# Patient Record
Sex: Male | Born: 1960 | Race: White | Hispanic: No | Smoking: Current every day smoker
Health system: Southern US, Community
[De-identification: ages and names within clinical notes are randomized; demographics above are authoritative.]

## PROBLEM LIST (undated history)

## (undated) DIAGNOSIS — I639 Cerebral infarction, unspecified: Secondary | ICD-10-CM

## (undated) DIAGNOSIS — E119 Type 2 diabetes mellitus without complications: Secondary | ICD-10-CM

## (undated) DIAGNOSIS — Z8619 Personal history of other infectious and parasitic diseases: Secondary | ICD-10-CM

## (undated) DIAGNOSIS — I10 Essential (primary) hypertension: Secondary | ICD-10-CM

## (undated) DIAGNOSIS — M1990 Unspecified osteoarthritis, unspecified site: Secondary | ICD-10-CM

## (undated) DIAGNOSIS — T7840XA Allergy, unspecified, initial encounter: Secondary | ICD-10-CM

## (undated) DIAGNOSIS — K219 Gastro-esophageal reflux disease without esophagitis: Secondary | ICD-10-CM

## (undated) DIAGNOSIS — K635 Polyp of colon: Secondary | ICD-10-CM

## (undated) HISTORY — DX: Personal history of other infectious and parasitic diseases: Z86.19

## (undated) HISTORY — DX: Gastro-esophageal reflux disease without esophagitis: K21.9

## (undated) HISTORY — PX: APPENDECTOMY: SHX54

## (undated) HISTORY — DX: Unspecified osteoarthritis, unspecified site: M19.90

## (undated) HISTORY — DX: Cerebral infarction, unspecified: I63.9

## (undated) HISTORY — DX: Allergy, unspecified, initial encounter: T78.40XA

## (undated) HISTORY — DX: Polyp of colon: K63.5

---

## 2004-01-25 ENCOUNTER — Observation Stay (HOSPITAL_COMMUNITY): Admission: EM | Admit: 2004-01-25 | Discharge: 2004-01-26 | Payer: Self-pay | Admitting: Emergency Medicine

## 2004-02-15 ENCOUNTER — Emergency Department (HOSPITAL_COMMUNITY): Admission: EM | Admit: 2004-02-15 | Discharge: 2004-02-16 | Payer: Self-pay | Admitting: Emergency Medicine

## 2005-03-01 IMAGING — CR DG CHEST 1V PORT
1 series · 1 of 1 positions shown · non-contrast
Comparison: none

CLINICAL DATA: Chest pain.
 PORTABLE CHEST 
 AP semierect portable with no priors.
 Heart and vascularity are within normal limits for portable technique.  The lungs are clear.

[view not recorded]
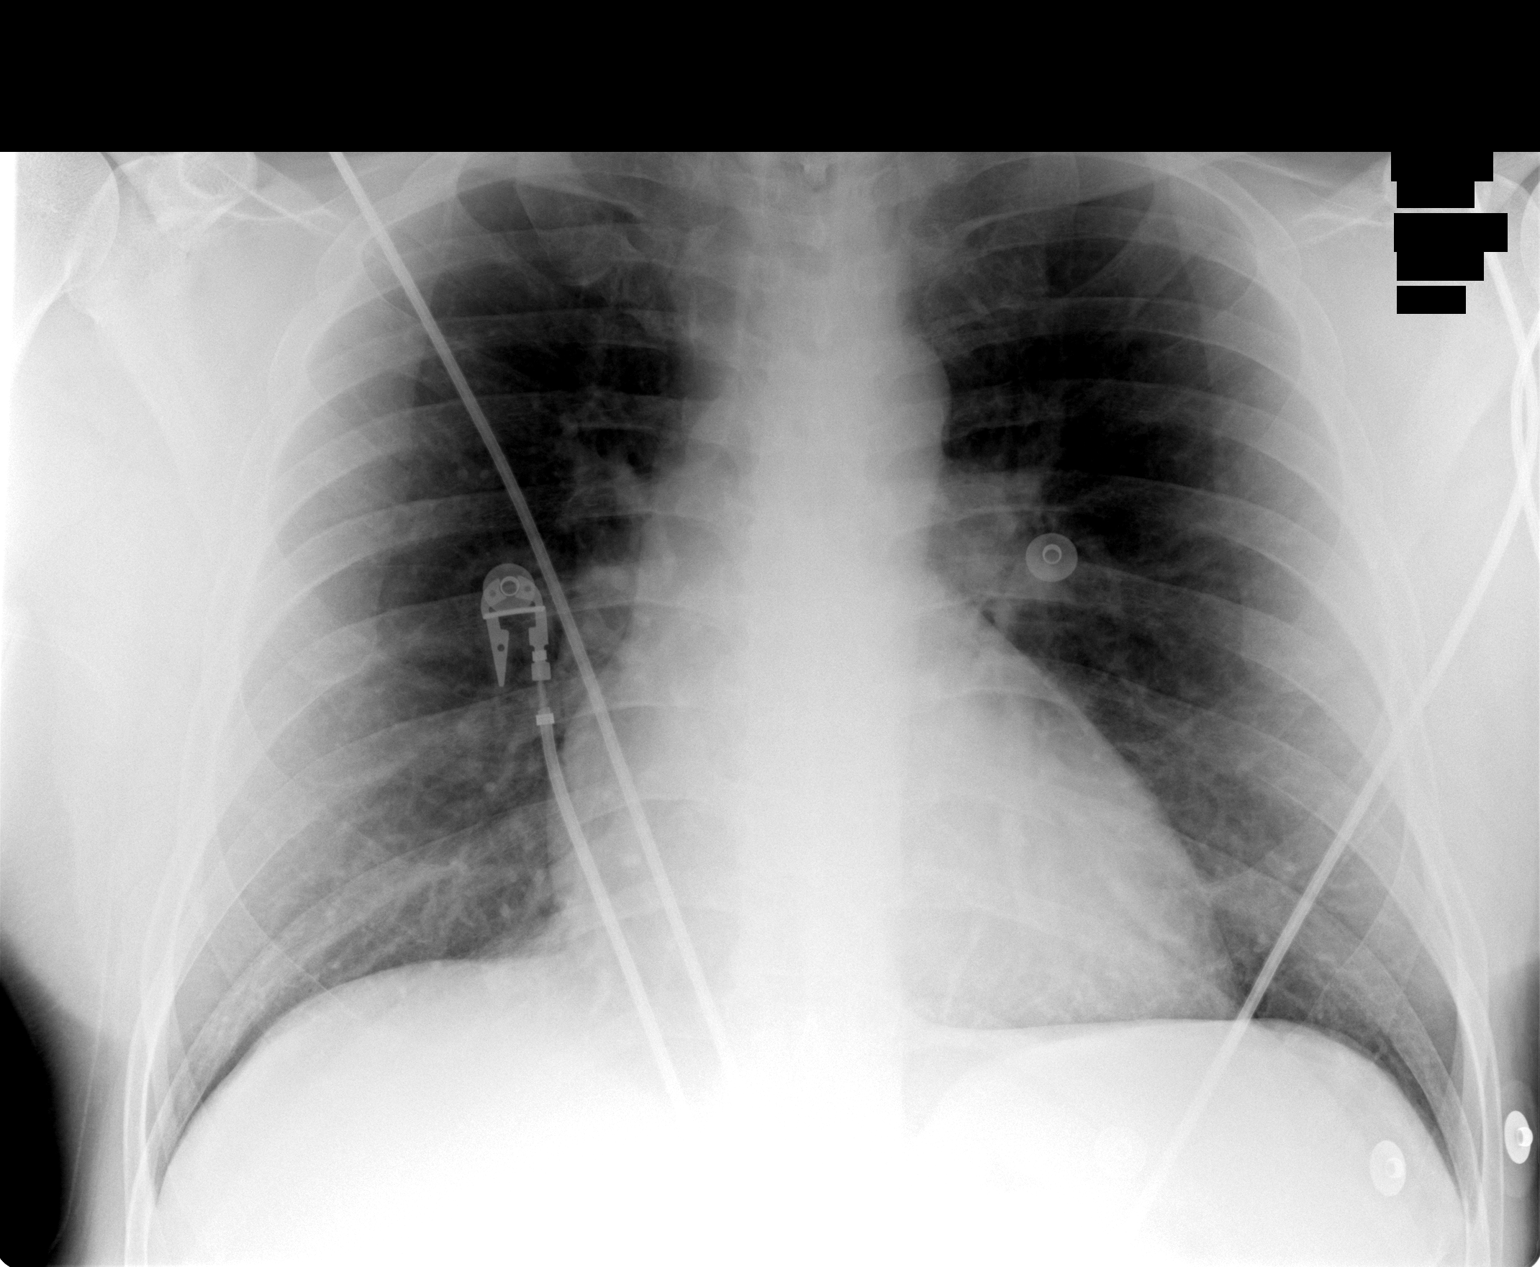

[1 of 1 positions shown; findings below may reference images not displayed]

IMPRESSION: No active disease.

## 2005-03-23 IMAGING — CT CT CHEST W/ CM
1 of 3 series · 15 of 28 positions shown, 19 images · IV contrast (omnipaque)
Comparison: none

CLINICAL DATA: 42-year-old hypertensive with chest pain.
 CT CHEST WITH CONTRAST
 Helical CT examination of the chest was performed after the bolus infusion of 150 cc of Omnipaque 300 using pulmonary embolism protocol.  The chest wall, soft tissues and bony structures are unremarkable. 
 Examination of the mediastinum demonstrates a normal heart size.  No pericardial effusion.  No mediastinal or hilar adenopathy.  The esophagus appears normal.  The pulmonary arterial tree is well opacified and no filling defects are seen to suggest pulmonary emboli. 
 The ascending aorta is slightly prominent with maximal measurements of 3.7 x 3.6 cm.  This probably warrants surveillance.  The descending thoracic aorta is normal in caliber and there is no evidence for a dissection. 
 The lungs are clear.  
 IMPRESSION
 1.  No CT evidence for pulmonary emboli. 
 2.  No acute pulmonary findings. 
 3.  Mild fusiform dilatation of the ascending aorta.  Recommend surveillance given the patient?s age.
 CT LOWER EXTREMITIES WITH CONTRAST, LIMITED
 CT examination of the lower extremities and pelvis with noncontiguous images demonstrates no filling defects to suggest deep venous thrombosis.  
 No CT evidence for deep venous thrombosis in the lower extremities or pelvis.

[Series 2: pe · axial · 0.70mm/px · z∈[-266,-58]mm · 15 of 190 slices shown, 19 images]
[im 12/190  mediastinal]
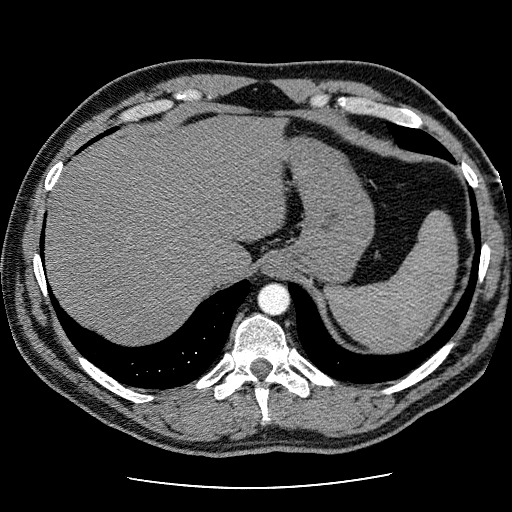
[im 12/190  lung]
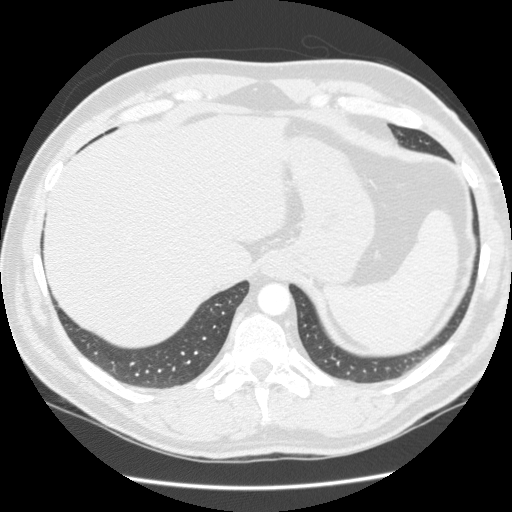
[im 24/190  lung]
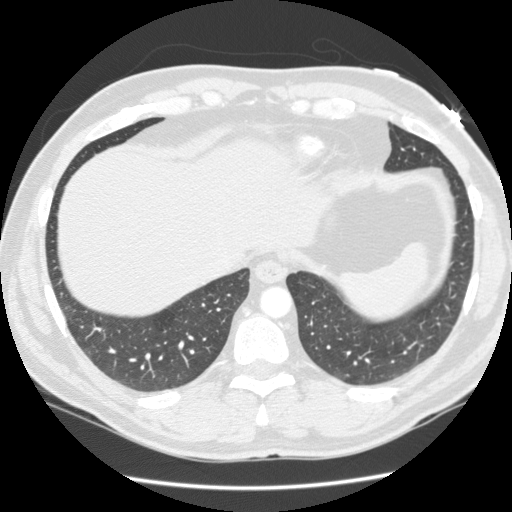
[im 36/190  lung]
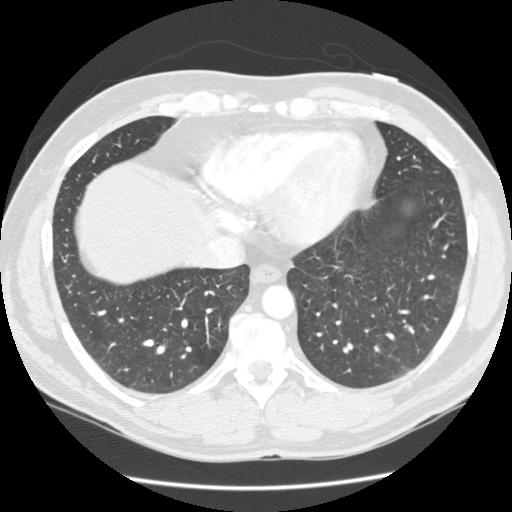
[im 48/190  lung]
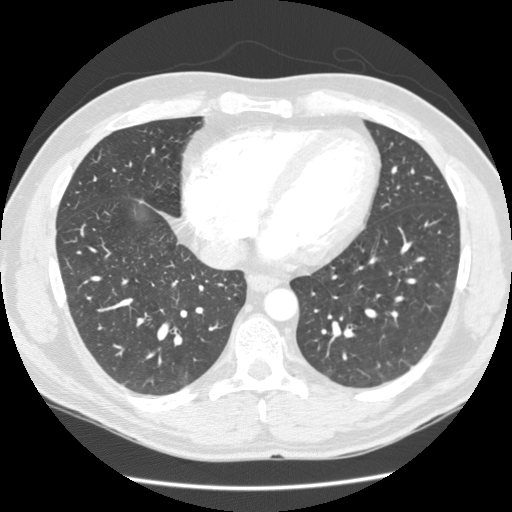
[im 60/190  mediastinal]
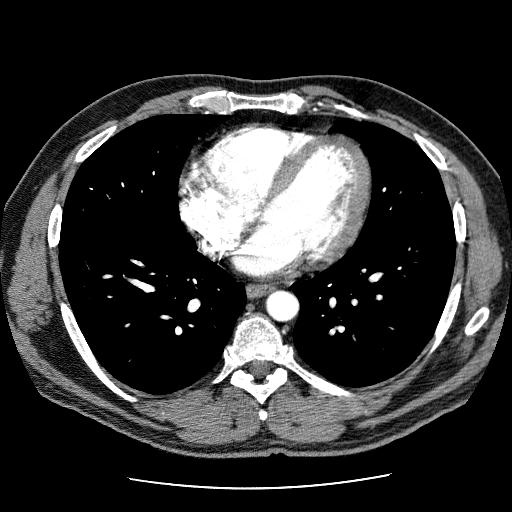
[im 60/190  lung]
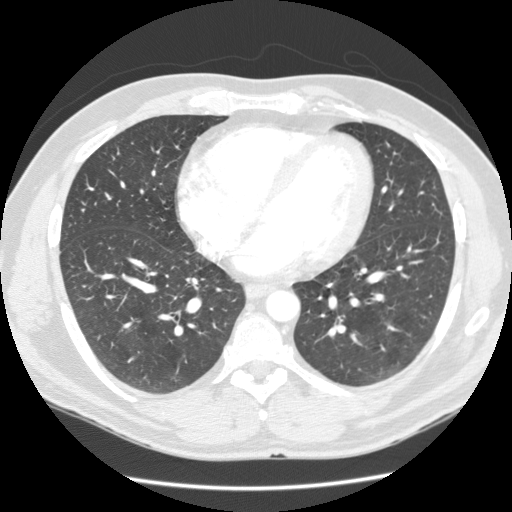
[im 71/190  lung]
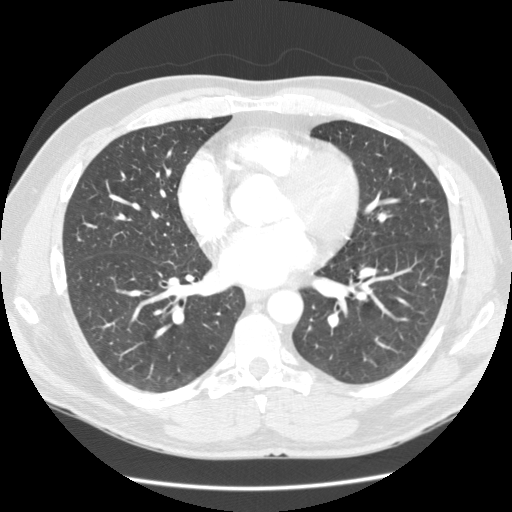
[im 83/190  lung]
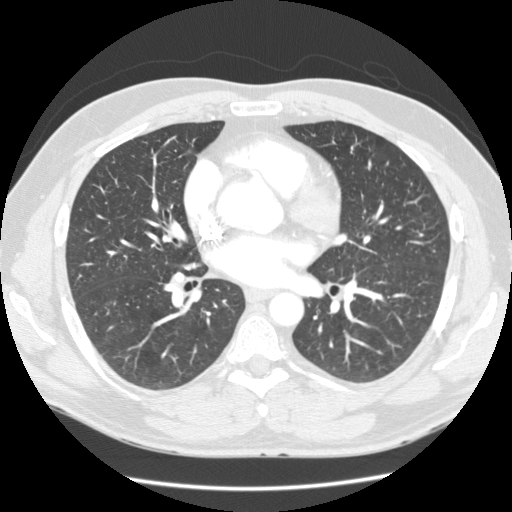
[im 95/190  lung]
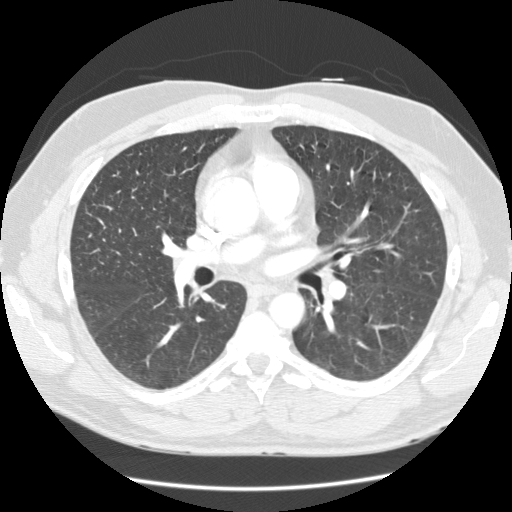
[im 107/190  mediastinal]
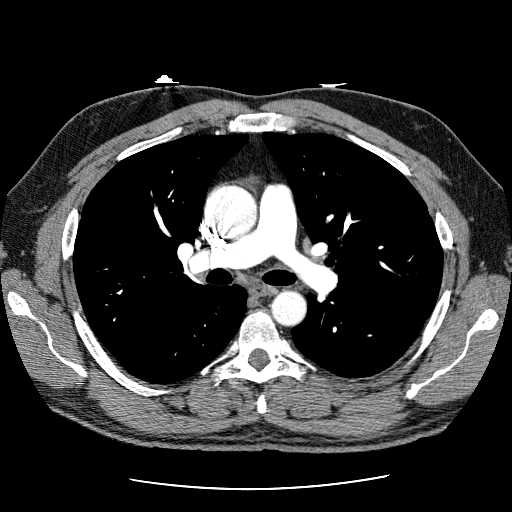
[im 107/190  lung]
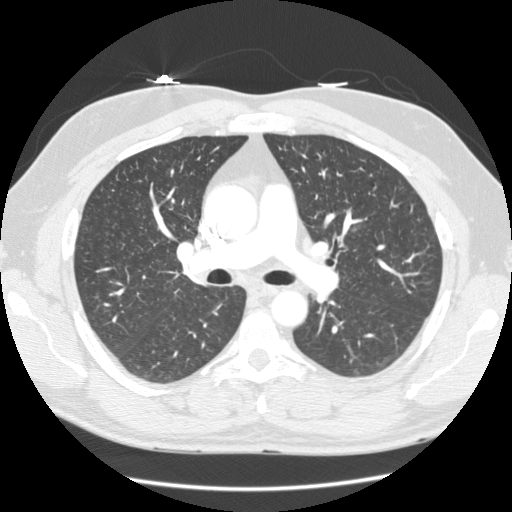
[im 119/190  lung]
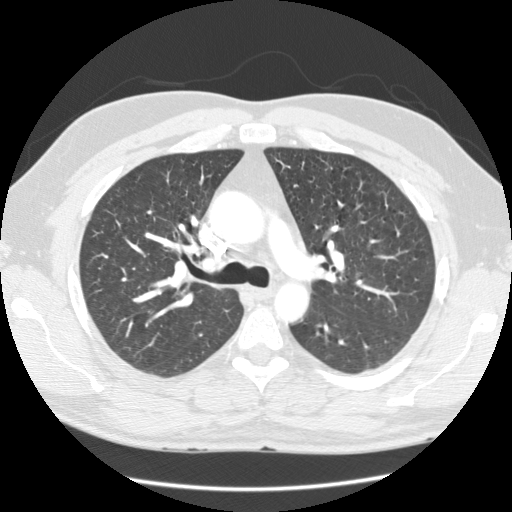
[im 130/190  lung]
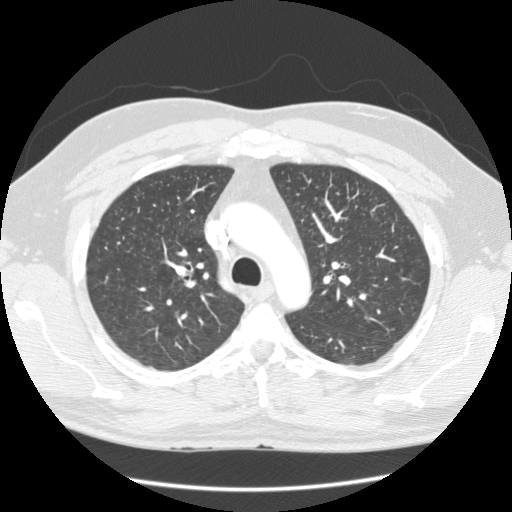
[im 142/190  lung]
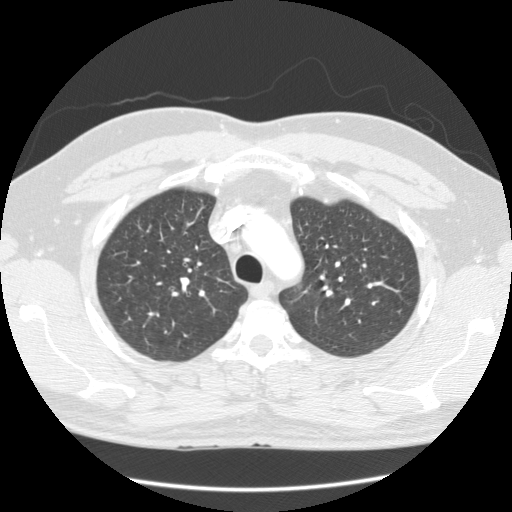
[im 154/190  mediastinal]
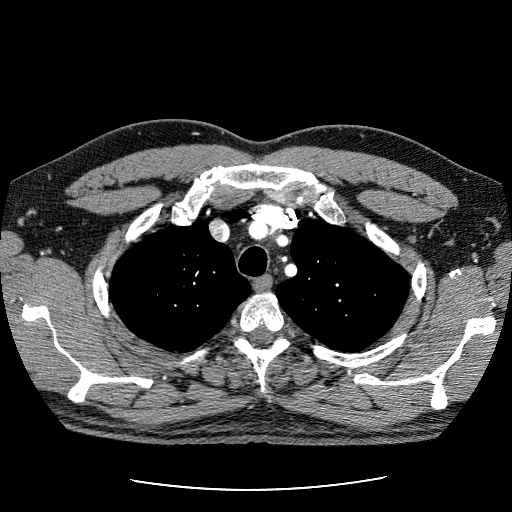
[im 154/190  lung]
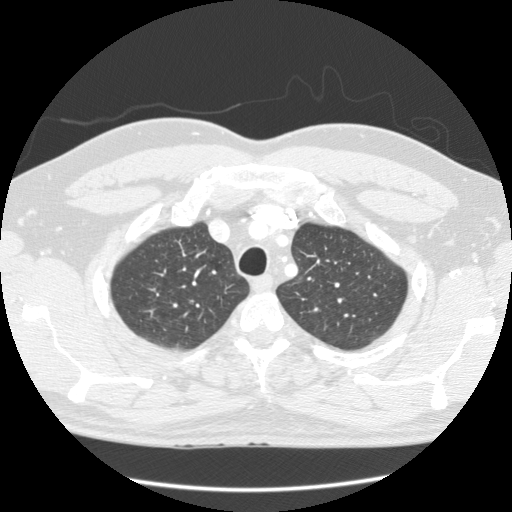
[im 166/190  lung]
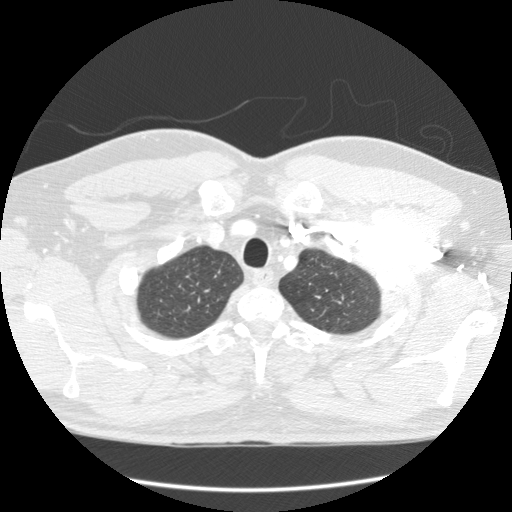
[im 178/190  lung]
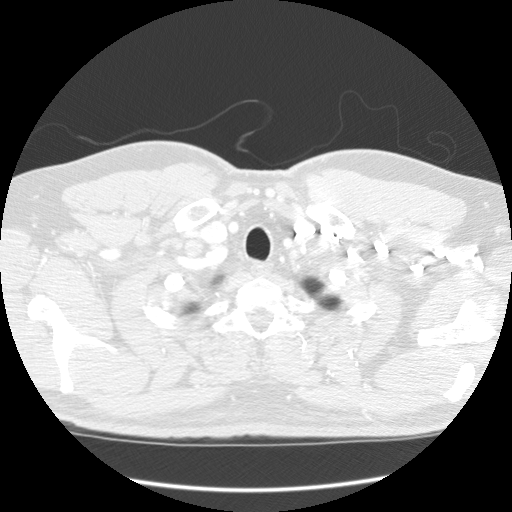

[15 of 28 positions shown; findings below may reference images not displayed]

## 2005-06-11 ENCOUNTER — Ambulatory Visit: Payer: Self-pay | Admitting: Internal Medicine

## 2005-06-19 ENCOUNTER — Ambulatory Visit: Payer: Self-pay | Admitting: Internal Medicine

## 2005-08-14 ENCOUNTER — Ambulatory Visit: Payer: Self-pay | Admitting: Internal Medicine

## 2019-01-02 DIAGNOSIS — I10 Essential (primary) hypertension: Secondary | ICD-10-CM | POA: Insufficient documentation

## 2020-04-22 DIAGNOSIS — F17200 Nicotine dependence, unspecified, uncomplicated: Secondary | ICD-10-CM | POA: Diagnosis present

## 2022-01-30 DIAGNOSIS — Z719 Counseling, unspecified: Secondary | ICD-10-CM | POA: Diagnosis not present

## 2022-01-30 DIAGNOSIS — M25519 Pain in unspecified shoulder: Secondary | ICD-10-CM | POA: Diagnosis not present

## 2022-01-30 DIAGNOSIS — M25512 Pain in left shoulder: Secondary | ICD-10-CM | POA: Diagnosis not present

## 2022-01-30 DIAGNOSIS — X503XXA Overexertion from repetitive movements, initial encounter: Secondary | ICD-10-CM | POA: Diagnosis not present

## 2022-01-30 DIAGNOSIS — T148XXA Other injury of unspecified body region, initial encounter: Secondary | ICD-10-CM | POA: Diagnosis not present

## 2022-02-14 DIAGNOSIS — N411 Chronic prostatitis: Secondary | ICD-10-CM | POA: Diagnosis not present

## 2022-02-14 DIAGNOSIS — E1169 Type 2 diabetes mellitus with other specified complication: Secondary | ICD-10-CM | POA: Diagnosis not present

## 2022-02-14 DIAGNOSIS — N521 Erectile dysfunction due to diseases classified elsewhere: Secondary | ICD-10-CM | POA: Diagnosis not present

## 2022-02-14 DIAGNOSIS — E119 Type 2 diabetes mellitus without complications: Secondary | ICD-10-CM | POA: Diagnosis not present

## 2022-02-19 DIAGNOSIS — E1169 Type 2 diabetes mellitus with other specified complication: Secondary | ICD-10-CM | POA: Diagnosis not present

## 2022-02-19 DIAGNOSIS — E785 Hyperlipidemia, unspecified: Secondary | ICD-10-CM | POA: Diagnosis not present

## 2022-02-19 DIAGNOSIS — I10 Essential (primary) hypertension: Secondary | ICD-10-CM | POA: Diagnosis not present

## 2022-02-19 DIAGNOSIS — N521 Erectile dysfunction due to diseases classified elsewhere: Secondary | ICD-10-CM | POA: Diagnosis not present

## 2022-02-26 DIAGNOSIS — E119 Type 2 diabetes mellitus without complications: Secondary | ICD-10-CM | POA: Diagnosis not present

## 2022-02-26 DIAGNOSIS — I10 Essential (primary) hypertension: Secondary | ICD-10-CM | POA: Diagnosis not present

## 2022-02-26 DIAGNOSIS — N521 Erectile dysfunction due to diseases classified elsewhere: Secondary | ICD-10-CM | POA: Diagnosis not present

## 2022-02-26 DIAGNOSIS — E785 Hyperlipidemia, unspecified: Secondary | ICD-10-CM | POA: Diagnosis not present

## 2022-04-18 DIAGNOSIS — E1169 Type 2 diabetes mellitus with other specified complication: Secondary | ICD-10-CM | POA: Diagnosis not present

## 2022-04-18 DIAGNOSIS — N521 Erectile dysfunction due to diseases classified elsewhere: Secondary | ICD-10-CM | POA: Diagnosis not present

## 2022-04-18 DIAGNOSIS — N411 Chronic prostatitis: Secondary | ICD-10-CM | POA: Diagnosis not present

## 2022-08-15 DIAGNOSIS — E119 Type 2 diabetes mellitus without complications: Secondary | ICD-10-CM | POA: Diagnosis not present

## 2022-08-15 DIAGNOSIS — Z7984 Long term (current) use of oral hypoglycemic drugs: Secondary | ICD-10-CM | POA: Diagnosis not present

## 2022-08-20 DIAGNOSIS — E785 Hyperlipidemia, unspecified: Secondary | ICD-10-CM | POA: Diagnosis not present

## 2022-08-20 DIAGNOSIS — E119 Type 2 diabetes mellitus without complications: Secondary | ICD-10-CM | POA: Diagnosis not present

## 2022-08-20 DIAGNOSIS — I10 Essential (primary) hypertension: Secondary | ICD-10-CM | POA: Diagnosis not present

## 2022-08-20 DIAGNOSIS — Z0000 Encounter for general adult medical examination without abnormal findings: Secondary | ICD-10-CM | POA: Diagnosis not present

## 2022-08-27 DIAGNOSIS — F17210 Nicotine dependence, cigarettes, uncomplicated: Secondary | ICD-10-CM | POA: Diagnosis not present

## 2022-08-27 DIAGNOSIS — E1169 Type 2 diabetes mellitus with other specified complication: Secondary | ICD-10-CM | POA: Diagnosis not present

## 2022-08-27 DIAGNOSIS — Z0000 Encounter for general adult medical examination without abnormal findings: Secondary | ICD-10-CM | POA: Diagnosis not present

## 2022-08-27 DIAGNOSIS — I10 Essential (primary) hypertension: Secondary | ICD-10-CM | POA: Diagnosis not present

## 2022-08-27 DIAGNOSIS — Z1389 Encounter for screening for other disorder: Secondary | ICD-10-CM | POA: Diagnosis not present

## 2022-08-27 DIAGNOSIS — E785 Hyperlipidemia, unspecified: Secondary | ICD-10-CM | POA: Diagnosis not present

## 2022-08-27 DIAGNOSIS — Z7902 Long term (current) use of antithrombotics/antiplatelets: Secondary | ICD-10-CM | POA: Diagnosis not present

## 2022-08-27 DIAGNOSIS — Z716 Tobacco abuse counseling: Secondary | ICD-10-CM | POA: Diagnosis not present

## 2022-09-04 DIAGNOSIS — Z136 Encounter for screening for cardiovascular disorders: Secondary | ICD-10-CM | POA: Diagnosis not present

## 2022-09-04 DIAGNOSIS — F17200 Nicotine dependence, unspecified, uncomplicated: Secondary | ICD-10-CM | POA: Diagnosis not present

## 2022-09-04 DIAGNOSIS — Z87891 Personal history of nicotine dependence: Secondary | ICD-10-CM | POA: Diagnosis not present

## 2022-09-04 DIAGNOSIS — Z122 Encounter for screening for malignant neoplasm of respiratory organs: Secondary | ICD-10-CM | POA: Diagnosis not present

## 2022-10-28 ENCOUNTER — Emergency Department (HOSPITAL_COMMUNITY): Payer: BC Managed Care – PPO

## 2022-10-28 ENCOUNTER — Other Ambulatory Visit (HOSPITAL_COMMUNITY): Payer: Self-pay | Admitting: *Deleted

## 2022-10-28 ENCOUNTER — Observation Stay (HOSPITAL_COMMUNITY)
Admission: EM | Admit: 2022-10-28 | Discharge: 2022-10-29 | Disposition: A | Discharge status: Home / Self Care | Payer: BC Managed Care – PPO | Attending: Internal Medicine | Admitting: Internal Medicine

## 2022-10-28 ENCOUNTER — Observation Stay (HOSPITAL_BASED_OUTPATIENT_CLINIC_OR_DEPARTMENT_OTHER): Payer: BC Managed Care – PPO

## 2022-10-28 ENCOUNTER — Other Ambulatory Visit: Payer: Self-pay

## 2022-10-28 ENCOUNTER — Encounter (HOSPITAL_COMMUNITY): Payer: Self-pay | Admitting: Emergency Medicine

## 2022-10-28 DIAGNOSIS — F17210 Nicotine dependence, cigarettes, uncomplicated: Secondary | ICD-10-CM | POA: Insufficient documentation

## 2022-10-28 DIAGNOSIS — M47812 Spondylosis without myelopathy or radiculopathy, cervical region: Secondary | ICD-10-CM | POA: Diagnosis not present

## 2022-10-28 DIAGNOSIS — I339 Acute and subacute endocarditis, unspecified: Secondary | ICD-10-CM | POA: Diagnosis not present

## 2022-10-28 DIAGNOSIS — I639 Cerebral infarction, unspecified: Secondary | ICD-10-CM | POA: Diagnosis not present

## 2022-10-28 DIAGNOSIS — I6359 Cerebral infarction due to unspecified occlusion or stenosis of other cerebral artery: Secondary | ICD-10-CM

## 2022-10-28 DIAGNOSIS — E785 Hyperlipidemia, unspecified: Secondary | ICD-10-CM | POA: Insufficient documentation

## 2022-10-28 DIAGNOSIS — R29701 NIHSS score 1: Secondary | ICD-10-CM | POA: Diagnosis not present

## 2022-10-28 DIAGNOSIS — E119 Type 2 diabetes mellitus without complications: Secondary | ICD-10-CM

## 2022-10-28 DIAGNOSIS — Z7984 Long term (current) use of oral hypoglycemic drugs: Secondary | ICD-10-CM | POA: Insufficient documentation

## 2022-10-28 DIAGNOSIS — R200 Anesthesia of skin: Secondary | ICD-10-CM | POA: Diagnosis not present

## 2022-10-28 DIAGNOSIS — E782 Mixed hyperlipidemia: Secondary | ICD-10-CM

## 2022-10-28 DIAGNOSIS — Z7982 Long term (current) use of aspirin: Secondary | ICD-10-CM | POA: Diagnosis not present

## 2022-10-28 DIAGNOSIS — Z794 Long term (current) use of insulin: Secondary | ICD-10-CM | POA: Diagnosis not present

## 2022-10-28 DIAGNOSIS — I10 Essential (primary) hypertension: Secondary | ICD-10-CM | POA: Insufficient documentation

## 2022-10-28 DIAGNOSIS — E876 Hypokalemia: Secondary | ICD-10-CM | POA: Diagnosis not present

## 2022-10-28 DIAGNOSIS — F17200 Nicotine dependence, unspecified, uncomplicated: Secondary | ICD-10-CM | POA: Diagnosis present

## 2022-10-28 DIAGNOSIS — Z79899 Other long term (current) drug therapy: Secondary | ICD-10-CM | POA: Insufficient documentation

## 2022-10-28 DIAGNOSIS — R202 Paresthesia of skin: Secondary | ICD-10-CM

## 2022-10-28 HISTORY — DX: Type 2 diabetes mellitus without complications: E11.9

## 2022-10-28 HISTORY — DX: Essential (primary) hypertension: I10

## 2022-10-28 LAB — CBC
HCT: 44 % (ref 39.0–52.0)
HCT: 41.9 % (ref 39.0–52.0)
Hemoglobin: 15.2 g/dL (ref 13.0–17.0)
Hemoglobin: 14.8 g/dL (ref 13.0–17.0)
MCH: 30 pg (ref 26.0–34.0)
MCH: 30.5 pg (ref 26.0–34.0)
MCHC: 34.5 g/dL (ref 30.0–36.0)
MCHC: 35.3 g/dL (ref 30.0–36.0)
MCV: 86.8 fL (ref 80.0–100.0)
MCV: 86.2 fL (ref 80.0–100.0)
Platelets: 223 10*3/uL (ref 150–400)
Platelets: 235 10*3/uL (ref 150–400)
RBC: 5.07 MIL/uL (ref 4.22–5.81)
RBC: 4.86 MIL/uL (ref 4.22–5.81)
RDW: 14.2 % (ref 11.5–15.5)
RDW: 14.2 % (ref 11.5–15.5)
WBC: 10 10*3/uL (ref 4.0–10.5)
WBC: 11.2 10*3/uL — ABNORMAL HIGH (ref 4.0–10.5)
nRBC: 0 % (ref 0.0–0.2)
nRBC: 0 % (ref 0.0–0.2)

## 2022-10-28 LAB — GLUCOSE, CAPILLARY
Glucose-Capillary: 115 mg/dL — ABNORMAL HIGH (ref 70–99)
Glucose-Capillary: 259 mg/dL — ABNORMAL HIGH (ref 70–99)

## 2022-10-28 LAB — RAPID URINE DRUG SCREEN, HOSP PERFORMED
Amphetamines: NOT DETECTED
Barbiturates: NOT DETECTED
Benzodiazepines: NOT DETECTED
Cocaine: NOT DETECTED
Opiates: NOT DETECTED
Tetrahydrocannabinol: NOT DETECTED

## 2022-10-28 LAB — URINALYSIS, ROUTINE W REFLEX MICROSCOPIC
Bacteria, UA: NONE SEEN
Bilirubin Urine: NEGATIVE
Glucose, UA: >=500 mg/dL — AB
Hgb urine dipstick: NEGATIVE
Ketones, ur: NEGATIVE mg/dL
Leukocytes,Ua: NEGATIVE
Nitrite: NEGATIVE
Protein, ur: NEGATIVE mg/dL
Specific Gravity, Urine: 1.001 — ABNORMAL LOW (ref 1.005–1.030)
pH: 6 (ref 5.0–8.0)

## 2022-10-28 LAB — DIFFERENTIAL
Abs Immature Granulocytes: 0.03 10*3/uL (ref 0.00–0.07)
Basophils Absolute: 0.1 10*3/uL (ref 0.0–0.1)
Basophils Relative: 1 %
Eosinophils Absolute: 0.3 10*3/uL (ref 0.0–0.5)
Eosinophils Relative: 3 %
Immature Granulocytes: 0 %
Lymphocytes Relative: 24 %
Lymphs Abs: 2.4 10*3/uL (ref 0.7–4.0)
Monocytes Absolute: 1 10*3/uL (ref 0.1–1.0)
Monocytes Relative: 10 %
Neutro Abs: 6.2 10*3/uL (ref 1.7–7.7)
Neutrophils Relative %: 62 %

## 2022-10-28 LAB — HIV ANTIBODY (ROUTINE TESTING W REFLEX): HIV Screen 4th Generation wRfx: NONREACTIVE

## 2022-10-28 LAB — ECHOCARDIOGRAM COMPLETE
Area-P 1/2: 5.02 cm2
Height: 72 in
S' Lateral: 2.9 cm
Weight: 2880 oz

## 2022-10-28 LAB — COMPREHENSIVE METABOLIC PANEL
ALT: 22 U/L (ref 0–44)
ALT: 22 U/L (ref 0–44)
AST: 22 U/L (ref 15–41)
AST: 20 U/L (ref 15–41)
Albumin: 4.1 g/dL (ref 3.5–5.0)
Albumin: 4 g/dL (ref 3.5–5.0)
Alkaline Phosphatase: 60 U/L (ref 38–126)
Alkaline Phosphatase: 62 U/L (ref 38–126)
Anion gap: 12 (ref 5–15)
Anion gap: 9 (ref 5–15)
BUN: 13 mg/dL (ref 8–23)
BUN: 12 mg/dL (ref 8–23)
CO2: 21 mmol/L — ABNORMAL LOW (ref 22–32)
CO2: 22 mmol/L (ref 22–32)
Calcium: 8.7 mg/dL — ABNORMAL LOW (ref 8.9–10.3)
Calcium: 8.7 mg/dL — ABNORMAL LOW (ref 8.9–10.3)
Chloride: 102 mmol/L (ref 98–111)
Chloride: 106 mmol/L (ref 98–111)
Creatinine, Ser: 0.98 mg/dL (ref 0.61–1.24)
Creatinine, Ser: 0.84 mg/dL (ref 0.61–1.24)
GFR, Estimated: >60 mL/min (ref >60)
GFR, Estimated: >60 mL/min (ref >60)
Glucose, Bld: 265 mg/dL — ABNORMAL HIGH (ref 70–99)
Glucose, Bld: 168 mg/dL — ABNORMAL HIGH (ref 70–99)
Potassium: 3.1 mmol/L — ABNORMAL LOW (ref 3.5–5.1)
Potassium: 3.4 mmol/L — ABNORMAL LOW (ref 3.5–5.1)
Sodium: 135 mmol/L (ref 135–145)
Sodium: 137 mmol/L (ref 135–145)
Total Bilirubin: 0.3 mg/dL (ref 0.3–1.2)
Total Bilirubin: 0.4 mg/dL (ref 0.3–1.2)
Total Protein: 7.1 g/dL (ref 6.5–8.1)
Total Protein: 7.3 g/dL (ref 6.5–8.1)

## 2022-10-28 LAB — PROTIME-INR
INR: 0.8 (ref 0.8–1.2)
Prothrombin Time: 11.4 seconds (ref 11.4–15.2)

## 2022-10-28 LAB — LIPID PANEL
Cholesterol: 146 mg/dL (ref 0–200)
HDL: 51 mg/dL (ref >40)
LDL Cholesterol: 61 mg/dL (ref 0–99)
Total CHOL/HDL Ratio: 2.9 RATIO
Triglycerides: 168 mg/dL — ABNORMAL HIGH (ref <150)
VLDL: 34 mg/dL (ref 0–40)

## 2022-10-28 LAB — APTT: aPTT: 26 seconds (ref 24–36)

## 2022-10-28 LAB — CBG MONITORING, ED
Glucose-Capillary: 150 mg/dL — ABNORMAL HIGH (ref 70–99)
Glucose-Capillary: 291 mg/dL — ABNORMAL HIGH (ref 70–99)

## 2022-10-28 LAB — ETHANOL: Alcohol, Ethyl (B): <10 mg/dL (ref <10)

## 2022-10-28 MED ORDER — ACETAMINOPHEN 160 MG/5ML PO SOLN: 650.0000 mg | ORAL | Status: DC | PRN

## 2022-10-28 MED ORDER — ACETAMINOPHEN 325 MG PO TABS: 650.0000 mg | ORAL_TABLET | ORAL | Status: DC | PRN

## 2022-10-28 MED ORDER — ASPIRIN 81 MG PO CHEW
81.0000 mg | CHEWABLE_TABLET | Freq: Every day | ORAL | Status: DC
  Administered 2022-10-28 – 2022-10-29 (×2): 81 mg via ORAL
  Filled 2022-10-28 (×2): qty 1

## 2022-10-28 MED ORDER — STROKE: EARLY STAGES OF RECOVERY BOOK
Freq: Once | Status: AC
  Filled 2022-10-28 (×2): qty 1

## 2022-10-28 MED ORDER — HEPARIN SODIUM (PORCINE) 5000 UNIT/ML IJ SOLN
5000.0000 [IU] | Freq: Three times a day (TID) | INTRAMUSCULAR | Status: DC
  Administered 2022-10-28 – 2022-10-29 (×5): 5000 [IU] via SUBCUTANEOUS
  Filled 2022-10-28 (×5): qty 1

## 2022-10-28 MED ORDER — ATORVASTATIN CALCIUM 10 MG PO TABS
20.0000 mg | ORAL_TABLET | Freq: Every day | ORAL | Status: DC
  Administered 2022-10-28 – 2022-10-29 (×2): 20 mg via ORAL
  Filled 2022-10-28 (×2): qty 2

## 2022-10-28 MED ORDER — SENNOSIDES-DOCUSATE SODIUM 8.6-50 MG PO TABS: 1.0000 | ORAL_TABLET | Freq: Every evening | ORAL | Status: DC | PRN

## 2022-10-28 MED ORDER — CLOPIDOGREL BISULFATE 75 MG PO TABS
75.0000 mg | ORAL_TABLET | Freq: Every day | ORAL | Status: DC
  Administered 2022-10-28 – 2022-10-29 (×2): 75 mg via ORAL
  Filled 2022-10-28 (×2): qty 1

## 2022-10-28 MED ORDER — CLOPIDOGREL BISULFATE 75 MG PO TABS
300.0000 mg | ORAL_TABLET | Freq: Once | ORAL | Status: AC
  Administered 2022-10-28: 300 mg via ORAL
  Filled 2022-10-28: qty 4

## 2022-10-28 MED ORDER — INSULIN ASPART 100 UNIT/ML IJ SOLN
0.0000 [IU] | Freq: Three times a day (TID) | INTRAMUSCULAR | Status: DC
  Administered 2022-10-28: 2 [IU] via SUBCUTANEOUS
  Administered 2022-10-28: 8 [IU] via SUBCUTANEOUS
  Administered 2022-10-29: 5 [IU] via SUBCUTANEOUS
  Administered 2022-10-29: 3 [IU] via SUBCUTANEOUS
  Filled 2022-10-28 (×2): qty 1

## 2022-10-28 MED ORDER — INSULIN GLARGINE-YFGN 100 UNIT/ML ~~LOC~~ SOLN
7.0000 [IU] | Freq: Every day | SUBCUTANEOUS | Status: DC
  Administered 2022-10-28: 7 [IU] via SUBCUTANEOUS
  Filled 2022-10-28 (×3): qty 0.07

## 2022-10-28 MED ORDER — POTASSIUM CHLORIDE CRYS ER 20 MEQ PO TBCR
40.0000 meq | EXTENDED_RELEASE_TABLET | Freq: Once | ORAL | Status: AC
  Administered 2022-10-28: 40 meq via ORAL
  Filled 2022-10-28: qty 2

## 2022-10-28 MED ORDER — ACETAMINOPHEN 650 MG RE SUPP: 650.0000 mg | RECTAL | Status: DC | PRN

## 2022-10-28 MED ORDER — INSULIN ASPART 100 UNIT/ML IJ SOLN
0.0000 [IU] | Freq: Every day | INTRAMUSCULAR | Status: DC
  Administered 2022-10-28: 3 [IU] via SUBCUTANEOUS

## 2022-10-28 NOTE — H&P (Signed)
History and Physical    Patient: Aaron Martinez MCE:[id number] DOB: 04-16-1961 DOA: 10/28/2022 DOS: the patient was seen and examined on 10/28/2022 PCP: Gearlean Alf, FNP  Patient coming from: Home  Chief Complaint:  Chief Complaint  Patient presents with   Numbness   HPI: Carmin Dibartolo is a 61 y.o. male with medical history significant of diabetes mellitus type 2, hypertension, tobacco use disorder, hyperlipidemia, and more presents to the ED with a chief complaint of numbness.  Patient reports he was sitting watching TV when he noticed left arm, hand, face numbness.  He reports it was sudden onset.  He has never had anything like it before.  He has had where his lips have been done in the past, but that was from a kind of Chapstick he was using.  Patient reports that while he has had this numbness, he has not had any dysphagia, change in vision, change in hearing, dysarthria.  He reports he had no ataxia as well.  He does not think his left foot has been affected, there was one moment where it felt numb, but he attributes improvement to positional change.  Patient has otherwise been in his normal state of health.  He has had no chest pain, palpitations, headaches, infectious symptoms, or other complaints.  Patient does smoke a pack per day.  He does drink alcohol 2 beers 5 days/week.  He does not use illicit drugs.  Patient is vaccinated for COVID.  Patient is full code. Review of Systems: As mentioned in the history of present illness. All other systems reviewed and are negative. Past Medical History:  Diagnosis Date   Diabetes mellitus without complication (HCC)    Hypertension     Social History:  reports that he has been smoking cigarettes. He has never used smokeless tobacco. No history on file for alcohol use and drug use.    No family history on file.  Family history does not contribute  Prior to Admission medications   Medication Sig Start Date End Date Taking? Authorizing  Provider  atorvastatin (LIPITOR) 10 MG tablet Take 10 mg by mouth 3 (three) times a week. 10/13/22  Yes [provider]  hydrochlorothiazide (HYDRODIURIL) 25 MG tablet Take 25 mg by mouth daily. 10/13/22  Yes [provider]  insulin degludec (TRESIBA FLEXTOUCH) 100 UNIT/ML FlexTouch Pen SMARTSIG:10 Unit(s) SUB-Q Daily 03/24/20  Yes [provider]  losartan (COZAAR) 100 MG tablet Take 100 mg by mouth daily. 10/13/22  Yes [provider]  losartan-hydrochlorothiazide (HYZAAR) 100-25 MG tablet Take 1 tablet by mouth daily. 07/10/19  Yes [provider]  tadalafil (CIALIS) 5 MG tablet Take 5 mg by mouth daily. 10/10/22  Yes [provider]  amLODipine (NORVASC) 10 MG tablet Take 10 mg by mouth daily.    [provider]  atorvastatin (LIPITOR) 20 MG tablet Take by mouth.    [provider]  JANUMET 50-1000 MG tablet Take 1 tablet by mouth 2 (two) times daily.    [provider]  Study - CAPTIVA - aspirin 81 mg tablet (PI-Sethi) Chew by mouth.    [provider]    Physical Exam: Vitals:   10/28/22 0130 10/28/22 0200 10/28/22 0230 10/28/22 0300  BP: 136/66 133/70 113/72 (!) 142/66  Pulse: 76 68 68 75  Resp:  18 15 14   Temp: 97.7 F (36.5 C)     TempSrc: Axillary     SpO2: 97% 97% 94% 97%  Weight:      Height:  1.  General: Patient lying supine in bed,  no acute distress   2. Psychiatric: Alert and oriented x 3, mood and behavior normal for situation, pleasant and cooperative with exam   3. Neurologic: Speech and language are normal, face is symmetric, moves all 4 extremities voluntarily, at baseline without acute deficits on limited exam   4. HEENMT:  Head is atraumatic, normocephalic, pupils reactive to light, neck is supple, trachea is midline, mucous membranes are moist   5. Respiratory : Lungs are clear to auscultation bilaterally without wheezing, rhonchi, rales, no cyanosis, no increase  in work of breathing or accessory muscle use   6. Cardiovascular : Heart rate normal, rhythm is regular, murmur present, rubs or gallops, no peripheral edema, peripheral pulses palpated   7. Gastrointestinal:  Abdomen is soft, nondistended, nontender to palpation bowel sounds active, no masses or organomegaly palpated   8. Skin:  Skin is warm, dry and intact without rashes, acute lesions, or ulcers on limited exam   9.Musculoskeletal:  No acute deformities or trauma, no asymmetry in tone, no peripheral edema, peripheral pulses palpated, no tenderness to palpation in the extremities  Data Reviewed: In the ED 97.7-98, heart rate 68-83, respiratory rate 16-18, blood pressure 133/66-158/76 satting at 97-100% on room air No leukocytosis with a white blood cell count of 10.0, hemoglobin 15.2, platelets 223 Chemistry reveals a hypokalemia 3.1 and a hyperglycemia 255 UA reveals glucose urea UDS negative Alcohol level undetectable CT code stroke shows no evidence of acute process EKG shows a heart rate of 78, sinus rhythm, QTc 4 and 74 Plavix load 300 given in the ED 40 mEq of potassium given in the ED Neuro recommends admission for MRI  Assessment and Plan: * CVA (cerebral vascular accident) [facility]) - Patient complains of left-sided numbness - Not appreciable on exam - CT head shows no evidence of acute process - Patient has multiple risk factors with hypertension, hyperlipidemia, diabetes, current smoker - Permissive hypertension - Transfer to [facility] for MRI as we do not have MRI on the weekends - Echo in the a.m. - PT/OT/SLP eval and treat - Plavix load, continue Plavix and aspirin - Continue to monitor  DMII (diabetes mellitus, type 2) (HCC) - Patient takes 10 units of Tresiba at baseline - Reduce to 7 units while in the hospital - Sliding scale coverage - Hemoglobin A1c pending - Continue to monitor  HLD (hyperlipidemia) Continue statin  Hypokalemia Potassium 3.1 -  40 mEq of potassium replaced in the ED - Trend in the a.m.  Tobacco dependence Smokes 1 pack/day - Declines nicotine patch at this time - Advised on the importance of cessation      Advance Care Planning:   Code Status: Full Code  Consults: Neuro  Family Communication: No family at bedside  Severity of Illness: The appropriate patient status for this patient is OBSERVATION. Observation status is judged to be reasonable and necessary in order to provide the required intensity of service to ensure the patient's safety. The patient's presenting symptoms, physical exam findings, and initial radiographic and laboratory data in the context of their medical condition is felt to place them at decreased risk for further clinical deterioration. Furthermore, it is anticipated that the patient will be medically stable for discharge from the hospital within 2 midnights of admission.   Author: Lilyan Gilford, DO 10/28/2022 5:01 AM  For on call review [email or web address].

## 2022-10-28 NOTE — Progress Notes (Signed)
0050-call time 0055-exam started 0057-exam finished 0057-images sent to [facility] 0101-exam completed in [facility] radiology called.

## 2022-10-28 NOTE — ED Notes (Signed)
EDP Aaron Martinez at bedside. Aaron Martinez

## 2022-10-28 NOTE — ED Provider Notes (Signed)
[facility] EMERGENCY DEPARTMENT Provider Note   CSN: [id number] Arrival date & time: 10/28/22  0004     History  Chief Complaint  Patient presents with   Numbness    Aaron Martinez is a 61 y.o. male.  The history is provided by the patient.  He has history of hypertension, diabetes and comes in because of numbness in the left side of his face and left arm.  He noticed numbness at about 9:30 PM and started in his lip but then spread to his face and left arm.  Numbness is only on the left side.  He has not noted any weakness and he denies any headache or dizziness or incoordination.   Home Medications Prior to Admission medications   Not on File      Allergies    Patient has no allergy information on record.    Review of Systems   Review of Systems  All other systems reviewed and are negative.   Physical Exam Updated Vital Signs BP (!) 158/76 (BP Location: Left Arm)   Pulse 83   Temp 98 F (36.7 C) (Oral)   Resp 16   Ht 6' (1.829 m)   Wt 81.6 kg   SpO2 99%   BMI 24.41 kg/m  Physical Exam Vitals and nursing note reviewed.   61 year old male, resting comfortably and in no acute distress. Vital signs are significant for elevated blood pressure. Oxygen saturation is 99%, which is normal. Head is normocephalic and atraumatic. PERRLA, EOMI. Oropharynx is clear.  Tongue protrudes in the midline.  There is no facial asymmetry. Neck is nontender and supple without adenopathy or JVD.  There are no carotid bruits. Back is nontender and there is no CVA tenderness. Lungs are clear without rales, wheezes, or rhonchi. Chest is nontender. Heart has regular rate and rhythm without murmur. Abdomen is soft, flat, nontender. Extremities have no cyanosis or edema, full range of motion is present. Skin is warm and dry without rash. Neurologic: Mental status is normal, cranial nerves are intact.  Strength is 5/5 in all 4 extremities.  There is no pronator drift.  Sensory exam is  normal-no decreased pinprick sensation on the face or arm and there is no extinction on double simultaneous stimulation.  Finger-nose testing is normal bilaterally.  ED Results / Procedures / Treatments   Labs (all labs ordered are listed, but only abnormal results are displayed) Labs Reviewed  COMPREHENSIVE METABOLIC PANEL - Abnormal; Notable for the following components:      Result Value   Potassium 3.1 (*)    CO2 21 (*)    Glucose, Bld 265 (*)    Calcium 8.7 (*)    All other components within normal limits  ETHANOL  PROTIME-INR  APTT  CBC  DIFFERENTIAL  RAPID URINE DRUG SCREEN, HOSP PERFORMED  URINALYSIS, ROUTINE W REFLEX MICROSCOPIC    EKG EKG Interpretation  Date/Time:  Sunday October 28 2022 00:30:24 EST Ventricular Rate:  78 PR Interval:  141 QRS Duration: 152 QT Interval:  416 QTC Calculation: 474 R Axis:   75 Text Interpretation: Sinus rhythm Right bundle branch block When compared with ECG of 02/15/2001, Right bundle branch block is now present Confirmed by Dione Booze ([id number]) on 10/28/2022 12:43:43 AM  Radiology CT HEAD CODE STROKE WO CONTRAST  Result Date: 10/28/2022 CLINICAL DATA:  Code stroke. Numbness on left side of lip, progressed to left-side of face in the nose EXAM: CT HEAD WITHOUT CONTRAST TECHNIQUE: Contiguous axial images were  obtained from the base of the skull through the vertex without intravenous contrast. RADIATION DOSE REDUCTION: This exam was performed according to the departmental dose-optimization program which includes automated exposure control, adjustment of the mA and/or kV according to patient size and/or use of iterative reconstruction technique. COMPARISON:  02/15/2004 FINDINGS: Brain: No evidence of acute infarction, hemorrhage, cerebral edema, mass, mass effect, or midline shift. No hydrocephalus or extra-axial collection. Vascular: No hyperdense vessel. Skull: Negative for fracture or focal lesion. Sinuses/Orbits: Mucosal thickening in  the ethmoid air cells and right frontal sinus. The orbits are unremarkable. Other: The mastoid air cells are well aerated. ASPECTS [facility] Stroke Program Early CT Score) - Ganglionic level infarction (caudate, lentiform nuclei, internal capsule, insula, M1-M3 cortex): 7 - Supraganglionic infarction (M4-M6 cortex): 3 Total score (0-10 with 10 being normal): 10 IMPRESSION: No evidence of acute intracranial process. ASPECTS is 10. These results were called by telephone at the time of interpretation on 10/28/2022 at 1:06 am to provider Atari Novick [facility] , who verbally acknowledged these results. Electronically Signed   By: Wiliam Ke M.D.   On: 10/28/2022 01:06    Procedures Procedures  Cardiac monitor shows normal sinus rhythm, per my interpretation.  Medications Ordered in ED Medications - No data to display  ED Course/ Medical Decision Making/ A&P                           Medical Decision Making Amount and/or Complexity of Data Reviewed Labs: ordered. Radiology: ordered.  Risk Prescription drug management.   Numbness of the left side of face and arm concerning for stroke.  Although neurologic exam is normal, I feel it is important to proceed with stroke evaluation.  I have activated the code stroke process and of ordered stroke labs and CT and ordered teleneurology evaluation.  I have reviewed and interpreted his electrocardiogram and my interpretation is right bundle branch block which is new compared with 02/16/2004.  However, on care everywhere, I find a report of an ECG from 11/10/2018 showing right bundle branch block.  CT of head shows no acute process.  Have independently viewed the images, and also discussed the findings with the radiologist.  I agree with the radiologist's interpretation.  I have reviewed and interpreted his laboratory test, and my interpretation is elevated glucose consistent with history of diabetes, hypokalemia which is likely related to diuretic use, normal CBC and  coagulation studies.  Neurology consult is appreciated.  NIH scale is 1, he is not a candidate for any kind of intervention.  He does need stroke work-up and, per neurology consult, I have ordered a dose of clopidogrel for him.  I considered transfer to [facility] for emergent MRI scan, but provider there states that the facility is too busy to accept a transfer.  I will arrange for hospital admission here for further stroke work-up.  Case is discussed with Dr. Carren Rang of [facility] hospital[facility], who agrees to admit the patient.  CRITICAL CARE Performed by: Dione Booze Total critical care time: 40 minutes Critical care time was exclusive of separately billable procedures and treating other patients. Critical care was necessary to treat or prevent imminent or life-threatening deterioration. Critical care was time spent personally by me on the following activities: development of treatment plan with patient and/or surrogate as well as nursing, discussions with consultants, evaluation of patient's response to treatment, examination of patient, obtaining history from patient or surrogate, ordering and performing treatments and interventions,  ordering and review of laboratory studies, ordering and review of radiographic studies, pulse oximetry and re-evaluation of patient's condition.   Final Clinical Impression(s) / ED Diagnoses Final diagnoses:  Paresthesias  Diuretic-induced hypokalemia    Rx / DC Orders ED Discharge Orders     None         Dione Booze, MD 10/28/22 [phone]

## 2022-10-28 NOTE — Consult Note (Signed)
[facility] Tele[facility]urology Consult Services   Patient Name:   Aaron Martinez, Aaron Martinez Date of Birth:   [birth year] Identification Number:   MRN - [id number] Date of Service:   10/28/2022 01:00:32  Diagnosis:       R20.2 - Paresthesia of skin  Impression:      61 y/o M with history of HTN, DM2, chronic tobacco use, presenting with acute onset left-sided altered sensation, particularly over left face and left arm, left foot as well to a milder extent. Onset of symptoms around 8:30 PM on 11/25. NIHSS is 1 (mild sensory change over left face and left arm to light touch). NCHCT did not show acute abnormalities. Patient is not thrombolytic candidate since LKN > 4.5 hours. Low clinical suspicion for LVO based on mild sensory syndrome with NIHSS 1, and no cortical symptoms/signs on exam.    Highest clinical suspicion is for isolated sensory stroke (potentially thalamic location). Reasonable to be transferred to facility to get stat MRI brain, MRA Head/neck to evaluate for stroke, and if positive should be admitted for further work-up.  Our recommendations are outlined below.  Recommendations:        Stroke/Telemetry Floor       Neuro Checks       Bedside Swallow Eval       DVT Prophylaxis       IV Fluids, Normal Saline       Head of Bed 30 Degrees       Euglycemia and Avoid Hyperthermia (PRN Acetaminophen)       Bolus with Clopidogrel 300 mg bolus x1 and initiate dual antiplatelet therapy with Aspirin 81 mg daily and Clopidogrel 75 mg daily       Antihypertensives PRN if Blood pressure is greater than 220/120 or there is a concern for End organ damage/contraindications for permissive HTN. If blood pressure is greater than 220/120 give labetalol PO or IV or Vasotec IV with a goal of 15% reduction in BP during the first 24 hours.  Sign Out:       Discussed with Emergency Department Provider    ------------------------------------------------------------------------------  Advanced  Imaging: Advanced Imaging Deferred because:  Non-disabling symptoms as verified by the patient; no cortical signs so not consistent with LVO   Metrics: Last Known Well: 10/27/2022 20:30:00 [facility] Notification Time: 10/28/2022 01:00:32 Arrival Time: 10/28/2022 00:04:00 Stamp Time: 10/28/2022 01:00:32 Initial Response Time: 10/28/2022 01:04:19 Symptoms: left face and arm numbness. Initial patient interaction: 10/28/2022 01:07:41 NIHSS Assessment Completed: 10/28/2022 01:19:45 Patient is not a candidate for Thrombolytic. Thrombolytic Medical Decision: 10/28/2022 01:19:47 Patient was not deemed candidate for Thrombolytic because of following reasons: Last Well Known Above 4.5 Hours. No disabling symptoms.  CT head showed no acute hemorrhage or acute core infarct. I personally Reviewed the CT Head and it Showed no acute intracranial abnormalities  Primary Provider Notified of Diagnostic Impression and Management Plan on: 10/28/2022 01:29:11    ------------------------------------------------------------------------------  History of Present Illness: Patient is a 61 year old Male.  Patient was brought by private transportation with symptoms of left face and arm numbness. Patient reports feeling well throughout the day on 11/25, then around 8:30 PM, began to experience altered sensation originally through the left side of face, then progressed to the left arm, also feels altered sensation over his left foot. Describes the altered sensation as feeling "flushed". Denies having any weakness, no visual change, no speech/language difficulties, no headaches. No prior history of TIA or stroke.     Past Medical History:  Hypertension      Diabetes Mellitus      There is no history of Stroke      There is no history of Migraine Headaches  Medications:  No Anticoagulant use  Antiplatelet use: Yes aspirin 81 mg daily Reviewed EMR for current medications  Allergies:   Reviewed  Social History: Smoking: Yes Alcohol Use: Yes Drug Use: No  Family History:  There is no family history of premature cerebrovascular disease pertinent to this consultation  ROS : 14 Points Review of Systems was performed and was negative except mentioned in HPI.  Past Surgical History: There Is No Surgical History Contributory To Today's Visit    Examination: BP(158/76), Pulse(83), 1A: Level of Consciousness - Alert; keenly responsive + 0 1B: Ask Month and Age - Both Questions Right + 0 1C: Blink Eyes & Squeeze Hands - Performs Both Tasks + 0 2: Test Horizontal Extraocular Movements - Normal + 0 3: Test Visual Fields - No Visual Loss + 0 4: Test Facial Palsy (Use Grimace if Obtunded) - Normal symmetry + 0 5A: Test Left Arm Motor Drift - No Drift for 10 Seconds + 0 5B: Test Right Arm Motor Drift - No Drift for 10 Seconds + 0 6A: Test Left Leg Motor Drift - No Drift for 5 Seconds + 0 6B: Test Right Leg Motor Drift - No Drift for 5 Seconds + 0 7: Test Limb Ataxia (FNF/Heel-Shin) - No Ataxia + 0 8: Test Sensation - Mild-Moderate Loss: Less Sharp/More Dull + 1 9: Test Language/Aphasia - Normal; No aphasia + 0 10: Test Dysarthria - Normal + 0 11: Test Extinction/Inattention - No abnormality + 0  NIHSS Score: 1  NIHSS Free Text : reduced sensation over left face and left arm compared to the right  Pre-Morbid Modified Rankin Scale: 0 Points = No symptoms at all  Spoke with : Dr. Preston Fleeting  Patient/Family was informed the Neurology Consult would occur via TeleHealth consult by way of interactive audio and video telecommunications and consented to receiving care in this manner.   Patient is being evaluated for possible acute neurologic impairment and high probability of imminent or life-threatening deterioration. I spent total of 34 minutes providing care to this patient, including time for face to face visit via telemedicine, review of medical records, imaging studies  and discussion of findings with providers, the patient and/or family.   Dr Peri Jefferson   [facility] For Inpatient follow-up with [facility] physician please call [facility] [phone]. This is not an outpatient service. Post hospital discharge, please contact hospital directly.

## 2022-10-28 NOTE — Plan of Care (Signed)

## 2022-10-28 NOTE — Progress Notes (Signed)
*  PRELIMINARY RESULTS* Echocardiogram 2D Echocardiogram has been performed.  Stacey Drain 10/28/2022, 9:39 AM

## 2022-10-28 NOTE — Assessment & Plan Note (Signed)
Smokes 1 pack/day - Declines nicotine patch at this time - Advised on the importance of cessation

## 2022-10-28 NOTE — ED Notes (Signed)
[facility] called to set up transportation at this time. 

## 2022-10-28 NOTE — Assessment & Plan Note (Signed)
-   Patient takes 10 units of Tresiba at baseline - Reduce to 7 units while in the hospital - Sliding scale coverage - Hemoglobin A1c pending - Continue to monitor

## 2022-10-28 NOTE — Progress Notes (Signed)
  Transition of Care [facility]) Screening Note   Patient Details  Name: Aaron Martinez Date of Birth: [birth year]   Transition of Care [facility]) CM/SW Contact:    Villa Herb, LCSWA Phone Number: 10/28/2022, 11:29 AM    Transition of Care Department [facility]) has reviewed patient and no TOC needs have been identified at this time. We will continue to monitor patient advancement through interdisciplinary progression rounds. If new patient transition needs arise, please place a TOC consult.

## 2022-10-28 NOTE — Assessment & Plan Note (Signed)
Continue statin. 

## 2022-10-28 NOTE — Progress Notes (Signed)
CODE STROKE [place] @ [phone] LKWT @2030  per patient FANG-D negative From CT @ 0100 Paged TSMD @ 0100 Dr. on screen @ 0104 MRS 0

## 2022-10-28 NOTE — ED Notes (Signed)
Pt returned from CT. Onna Nodal Carmino Ocain,RN 

## 2022-10-28 NOTE — ED Notes (Signed)
Last known well is 7PM. Aaron Martinez

## 2022-10-28 NOTE — Progress Notes (Signed)
ASSUMPTION OF CARE NOTE   10/28/2022 10:39 AM  Aaron Martinez was seen and examined.  The H&P by the admitting provider, orders, imaging was reviewed.  Please see new orders.  Will continue to follow.  Pt seen in ED waiting for transfer to [facility] for MRI.  He remains symptomatic but stable.  He has been loaded with clopidogrel.  Appreciate teleneurology consultation and recommendations from Dr. Caryl Comes.  Updated wife at bedside.    Vitals:   10/28/22 0934 10/28/22 1015  BP:  128/75  Pulse:  63  Resp:  17  Temp: 97.8 F (36.6 C)   SpO2:  100%    Results for orders placed or performed during the hospital encounter of 10/28/22  Ethanol  Result Value Ref Range   Alcohol, Ethyl (B) <10 <10 mg/dL  Protime-INR  Result Value Ref Range   Prothrombin Time 11.4 11.4 - 15.2 seconds   INR 0.8 0.8 - 1.2  APTT  Result Value Ref Range   aPTT 26 24 - 36 seconds  CBC  Result Value Ref Range   WBC 10.0 4.0 - 10.5 K/uL   RBC 5.07 4.22 - 5.81 MIL/uL   Hemoglobin 15.2 13.0 - 17.0 g/dL   HCT 44.0 39.0 - 52.0 %   MCV 86.8 80.0 - 100.0 fL   MCH 30.0 26.0 - 34.0 pg   MCHC 34.5 30.0 - 36.0 g/dL   RDW 14.2 11.5 - 15.5 %   Platelets 223 150 - 400 K/uL   nRBC 0.0 0.0 - 0.2 %  Differential  Result Value Ref Range   Neutrophils Relative % 62 %   Neutro Abs 6.2 1.7 - 7.7 K/uL   Lymphocytes Relative 24 %   Lymphs Abs 2.4 0.7 - 4.0 K/uL   Monocytes Relative 10 %   Monocytes Absolute 1.0 0.1 - 1.0 K/uL   Eosinophils Relative 3 %   Eosinophils Absolute 0.3 0.0 - 0.5 K/uL   Basophils Relative 1 %   Basophils Absolute 0.1 0.0 - 0.1 K/uL   Immature Granulocytes 0 %   Abs Immature Granulocytes 0.03 0.00 - 0.07 K/uL  Comprehensive metabolic panel  Result Value Ref Range   Sodium 135 135 - 145 mmol/L   Potassium 3.1 (L) 3.5 - 5.1 mmol/L   Chloride 102 98 - 111 mmol/L   CO2 21 (L) 22 - 32 mmol/L   Glucose, Bld 265 (H) 70 - 99 mg/dL   BUN 13 8 - 23 mg/dL   Creatinine, Ser 0.98 0.61 - 1.24 mg/dL   Calcium  8.7 (L) 8.9 - 10.3 mg/dL   Total Protein 7.1 6.5 - 8.1 g/dL   Albumin 4.1 3.5 - 5.0 g/dL   AST 22 15 - 41 U/L   ALT 22 0 - 44 U/L   Alkaline Phosphatase 60 38 - 126 U/L   Total Bilirubin 0.3 0.3 - 1.2 mg/dL   GFR, Estimated >60 >60 mL/min   Anion gap 12 5 - 15  Urine rapid drug screen (hosp performed)  Result Value Ref Range   Opiates NONE DETECTED NONE DETECTED   Cocaine NONE DETECTED NONE DETECTED   Benzodiazepines NONE DETECTED NONE DETECTED   Amphetamines NONE DETECTED NONE DETECTED   Tetrahydrocannabinol NONE DETECTED NONE DETECTED   Barbiturates NONE DETECTED NONE DETECTED  Urinalysis, Routine w reflex microscopic Urine, Clean Catch  Result Value Ref Range   Color, Urine COLORLESS (A) YELLOW   APPearance CLEAR CLEAR   Specific Gravity, Urine 1.001 (L) 1.005 - 1.030  pH 6.0 5.0 - 8.0   Glucose, UA >=500 (A) NEGATIVE mg/dL   Hgb urine dipstick NEGATIVE NEGATIVE   Bilirubin Urine NEGATIVE NEGATIVE   Ketones, ur NEGATIVE NEGATIVE mg/dL   Protein, ur NEGATIVE NEGATIVE mg/dL   Nitrite NEGATIVE NEGATIVE   Leukocytes,Ua NEGATIVE NEGATIVE   RBC / HPF 0-5 0 - 5 RBC/hpf   WBC, UA 0-5 0 - 5 WBC/hpf   Bacteria, UA NONE SEEN NONE SEEN  Lipid panel  Result Value Ref Range   Cholesterol 146 0 - 200 mg/dL   Triglycerides 782 (H) <150 mg/dL   HDL 51 >42 mg/dL   Total CHOL/HDL Ratio 2.9 RATIO   VLDL 34 0 - 40 mg/dL   LDL Cholesterol 61 0 - 99 mg/dL  Comprehensive metabolic panel  Result Value Ref Range   Sodium 137 135 - 145 mmol/L   Potassium 3.4 (L) 3.5 - 5.1 mmol/L   Chloride 106 98 - 111 mmol/L   CO2 22 22 - 32 mmol/L   Glucose, Bld 168 (H) 70 - 99 mg/dL   BUN 12 8 - 23 mg/dL   Creatinine, Ser 3.53 0.61 - 1.24 mg/dL   Calcium 8.7 (L) 8.9 - 10.3 mg/dL   Total Protein 7.3 6.5 - 8.1 g/dL   Albumin 4.0 3.5 - 5.0 g/dL   AST 20 15 - 41 U/L   ALT 22 0 - 44 U/L   Alkaline Phosphatase 62 38 - 126 U/L   Total Bilirubin 0.4 0.3 - 1.2 mg/dL   GFR, Estimated >61 >44 mL/min    Anion gap 9 5 - 15  CBC  Result Value Ref Range   WBC 11.2 (H) 4.0 - 10.5 K/uL   RBC 4.86 4.22 - 5.81 MIL/uL   Hemoglobin 14.8 13.0 - 17.0 g/dL   HCT 31.5 40.0 - 86.7 %   MCV 86.2 80.0 - 100.0 fL   MCH 30.5 26.0 - 34.0 pg   MCHC 35.3 30.0 - 36.0 g/dL   RDW 61.9 50.9 - 32.6 %   Platelets 235 150 - 400 K/uL   nRBC 0.0 0.0 - 0.2 %  CBG monitoring, ED  Result Value Ref Range   Glucose-Capillary 150 (H) 70 - 99 mg/dL  ECHOCARDIOGRAM COMPLETE  Result Value Ref Range   Weight 2,880 oz   Height 72 in   BP 108/91 mmHg   Aaron Manuel, MD [facility]   10/28/2022 12:29 AM How to contact the [facility] Attending or Consulting provider 7A - 7P or covering provider during after hours 7P -7A, for this patient?  Check the care team in [facility] and look for a) attending/consulting TRH provider listed and b) the [facility] team listed Log into [email or web address] and use [facility] universal password to access. If you do not have the password, please contact the hospital operator. Locate the [facility] provider you are looking for under [facility] and page to a number that you can be directly reached. If you still have difficulty reaching the provider, please page the [facility] (Director on Call) for the Hospitalists listed on amion for assistance.

## 2022-10-28 NOTE — Assessment & Plan Note (Addendum)
-   Patient complains of left-sided numbness - Not appreciable on exam - CT head shows no evidence of acute process - Patient has multiple risk factors with hypertension, hyperlipidemia, diabetes, current smoker - Permissive hypertension - Transfer to [facility] for MRI as we do not have MRI on the weekends - Echo in the a.m. - PT/OT/SLP eval and treat - Plavix load, continue Plavix and aspirin - Continue to monitor

## 2022-10-28 NOTE — Assessment & Plan Note (Signed)
Potassium 3.1 - 40 mEq of potassium replaced in the ED - Trend in the a.m.

## 2022-10-28 NOTE — Evaluation (Signed)
Physical Therapy Evaluation Patient Details Name: Aaron Martinez MRN: [id number] DOB: 12/31/1960 Today's Date: 10/28/2022  History of Present Illness  Aaron Martinez is a 61 y.o. male with medical history significant of diabetes mellitus type 2, hypertension, tobacco use disorder, hyperlipidemia, and more presents to the ED with a chief complaint of numbness.  Patient reports he was sitting watching TV when he noticed left arm, hand, face numbness.  He reports it was sudden onset.  He has never had anything like it before.  He has had where his lips have been done in the past, but that was from a kind of Chapstick he was using.  Patient reports that while he has had this numbness, he has not had any dysphagia, change in vision, change in hearing, dysarthria.  He reports he had no ataxia as well.  He does not think his left foot has been affected, there was one moment where it felt numb, but he attributes improvement to positional change.  Patient has otherwise been in his normal state of health.  He has had no chest pain, palpitations, headaches, infectious symptoms, or other complaints.    Clinical Impression  Patient lying in bed on therapist arrival.  Significant other at bedside.  Patient reports no pain but continued numbness left side of his body mostly left arm and face.  He is able to move all extremities normally and grip with his left hand without issue.  No noted drooping of the face or slurred speech; denies difficulty swallowing.  Patient performs supine to sit independently; sit to stand independently and can stand with good balance without upper extremity support.  Patient ambulates out of room and around the nurses station without assistive device with good balance and no path deviation noted.  Patient does not need any further skilled therapy interventions at this time as he is at baseline for bed mobility, transfers and ambulation. Thank you.         Recommendations for follow up therapy  are one component of a multi-disciplinary discharge planning process, led by the attending physician.  Recommendations may be updated based on patient status, additional functional criteria and insurance authorization.  Follow Up Recommendations No PT follow up      Assistance Recommended at Discharge PRN  Patient can return home with the following  Help with stairs or ramp for entrance    Equipment Recommendations None recommended by PT  Recommendations for Other Services       Functional Status Assessment Patient has had a recent decline in their functional status and demonstrates the ability to make significant improvements in function in a reasonable and predictable amount of time.     Precautions / Restrictions Precautions Precautions: None Restrictions Weight Bearing Restrictions: No      Mobility  Bed Mobility Overal bed mobility: Independent                  Transfers Overall transfer level: Independent Equipment used: None                    Ambulation/Gait Ambulation/Gait assistance: Independent Gait Distance (Feet): 100 Feet Assistive device: None Gait Pattern/deviations: WFL(Within Functional Limits)       General Gait Details: normal gait speed; no path deviation noted  Stairs            Wheelchair Mobility    Modified Rankin (Stroke Patients Only)       Balance Overall balance assessment: Independent  Pertinent Vitals/Pain Pain Assessment Pain Assessment: No/denies pain    Home Living Family/patient expects to be discharged to:: Private residence Living Arrangements: Spouse/significant other Available Help at Discharge: Family Type of Home: Apartment Home Access: Stairs to enter Entrance Stairs-Rails: None Secretary/administrator of Steps: 3   Home Layout: One level Home Equipment: None      Prior Function Prior Level of Function :  Independent/Modified Independent                     Hand Dominance        Extremity/Trunk Assessment   Upper Extremity Assessment Upper Extremity Assessment: Defer to OT evaluation    Lower Extremity Assessment Lower Extremity Assessment: Overall WFL for tasks assessed    Cervical / Trunk Assessment Cervical / Trunk Assessment: Normal  Communication   Communication: No difficulties  Cognition Arousal/Alertness: Awake/alert Behavior During Therapy: WFL for tasks assessed/performed Overall Cognitive Status: Within Functional Limits for tasks assessed                                 General Comments: pleasant and cooperative        General Comments      Exercises     Assessment/Plan    PT Assessment Patient does not need any further PT services  PT Problem List         PT Treatment Interventions      PT Goals (Current goals can be found in the Care Plan section)  Acute Rehab PT Goals Patient Stated Goal: return home PT Goal Formulation: With patient/family Time For Goal Achievement: 11/11/22 Potential to Achieve Goals: Good    Frequency       Co-evaluation               AM-PAC PT "6 Clicks" Mobility  Outcome Measure Help needed turning from your back to your side while in a flat bed without using bedrails?: None Help needed moving from lying on your back to sitting on the side of a flat bed without using bedrails?: None Help needed moving to and from a bed to a chair (including a wheelchair)?: None Help needed standing up from a chair using your arms (e.g., wheelchair or bedside chair)?: None Help needed to walk in hospital room?: None Help needed climbing 3-5 steps with a railing? : A Little 6 Click Score: 23    End of Session   Activity Tolerance: Patient tolerated treatment well Patient left: in bed;with family/visitor present Nurse Communication: Mobility status PT Visit Diagnosis: Other symptoms and signs  involving the nervous system (R29.898)    Time: 6387-5643 PT Time Calculation (min) (ACUTE ONLY): 20 min   Charges:   PT Evaluation $PT Eval Low Complexity: 1 Low          10:11 AM, 10/28/22 Katalia Choma Small Terri Malerba MPT [facility] [facility] [place] [phone] Ph:[phone]

## 2022-10-28 NOTE — ED Notes (Signed)
ED Provider at bedside. 

## 2022-10-28 NOTE — ED Triage Notes (Signed)
Pt arrives POV c/o numbness that started to the left side of his lip that he noticed at about 2130. Pt states since then the numbness has progressed to the left side of his face and left side of his nose. Pt states he has also noted that his left arm is also feeling numb.   Pt denies any hx of same.

## 2022-10-28 NOTE — Evaluation (Signed)
Occupational Therapy Evaluation and Discharge Patient Details Name: Aaron MuttonJames Martinez MRN: [id number] DOB: 31-May-1961 Today's Date: 10/28/2022   History of Present Illness Aaron Martinez is a 61 y.o. male presents to the ED with a chief complaint of numbness left face and arm. CT-negative. MRI: pending. PHMx: diabetes mellitus type 2, hypertension, tobacco use disorder, hyperlipidemia.   Clinical Impression   This 61 yo male admitted with above presents to acute OT with PLOF of being totally independent with basic ADLs, IDALs, working, and driving. Currently is back to his baseline from a functional standpoint but still experiencing numbness,tingling in LUE and left side of face. No further skilled OT needs identified, we will sign off.      Recommendations for follow up therapy are one component of a multi-disciplinary discharge planning process, led by the attending physician.  Recommendations may be updated based on patient status, additional functional criteria and insurance authorization.   Follow Up Recommendations  No OT follow up     Assistance Recommended at Discharge None     Functional Status Assessment  Patient has not had a recent decline in their functional status        Precautions / Restrictions Precautions Precautions: None Restrictions Weight Bearing Restrictions: No      Mobility Bed Mobility Overal bed mobility: Independent                  Transfers Overall transfer level: Independent Equipment used: None                      Balance Overall balance assessment: Independent                                         ADL either performed or assessed with clinical judgement   ADL Overall ADL's : Independent                                       General ADL Comments: Spoke about using LUE to test hot items (ie water of shower), only use fork in left hand to hold object if cutting with sharp knife (not hold with  fingers only), don't hold/carry anyting in left hand that has hot contents (ie: coffee cup or pot of water).     Vision Patient Visual Report: No change from baseline              Pertinent Vitals/Pain Pain Assessment Pain Assessment: No/denies pain     Hand Dominance  right   Extremity/Trunk Assessment Upper Extremity Assessment Upper Extremity Assessment: LUE deficits/detail LUE Deficits / Details: AROM/strength WNL, proprioception intack, mild decreased sensation compared to RUE (for light touch and hot/cold)           Communication Communication Communication: No difficulties   Cognition Arousal/Alertness: Awake/alert Behavior During Therapy: WFL for tasks assessed/performed Overall Cognitive Status: Within Functional Limits for tasks assessed                                                  Home Living Family/patient expects to be discharged to:: Private residence Living Arrangements: Spouse/significant other Available Help at Discharge: Family;Available 24 hours/day Type  of Home: Apartment Home Access: Stairs to enter [employer] of Steps: 3 Entrance Stairs-Rails: None Home Layout: One level     Bathroom Shower/Tub: Chief Strategy Officer: Standard Bathroom Accessibility: Yes How Accessible: Accessible via walker Home Equipment: None          Prior Functioning/Environment Prior Level of Function : Independent/Modified Independent                        OT Problem List: Impaired sensation         OT Goals(Current goals can be found in the care plan section) Acute Rehab OT Goals Patient Stated Goal: to find out if he has had a stroke or not         AM-PAC OT "6 Clicks" Daily Activity     Outcome Measure Help from another person eating meals?: None Help from another person taking care of personal grooming?: None Help from another person toileting, which includes using toliet, bedpan, or  urinal?: None Help from another person bathing (including washing, rinsing, drying)?: None Help from another person to put on and taking off regular upper body clothing?: None Help from another person to put on and taking off regular lower body clothing?: None 6 Click Score: 24   End of Session Nurse Communication: Mobility status (can be up without A (safe to do so))  Activity Tolerance: Patient tolerated treatment well Patient left: in bed;with call bell/phone within reach (no bed alarm, pt Independent)                   Time: 9629-5284 OT Time Calculation (min): 13 min Charges:  OT General Charges $OT Visit: 1 Visit OT Evaluation $OT Eval Low Complexity: 1 Low  Aaron Martinez, OTR/L [facility] [phone] Office [phone]    Aaron Martinez 10/28/2022, 6:52 PM

## 2022-10-29 ENCOUNTER — Other Ambulatory Visit (HOSPITAL_COMMUNITY): Payer: Self-pay

## 2022-10-29 ENCOUNTER — Other Ambulatory Visit: Payer: Self-pay | Admitting: Cardiology

## 2022-10-29 ENCOUNTER — Observation Stay (HOSPITAL_COMMUNITY): Payer: BC Managed Care – PPO

## 2022-10-29 DIAGNOSIS — I6381 Other cerebral infarction due to occlusion or stenosis of small artery: Secondary | ICD-10-CM

## 2022-10-29 DIAGNOSIS — J3489 Other specified disorders of nose and nasal sinuses: Secondary | ICD-10-CM | POA: Diagnosis not present

## 2022-10-29 DIAGNOSIS — R200 Anesthesia of skin: Secondary | ICD-10-CM | POA: Diagnosis not present

## 2022-10-29 DIAGNOSIS — I639 Cerebral infarction, unspecified: Secondary | ICD-10-CM

## 2022-10-29 DIAGNOSIS — M47812 Spondylosis without myelopathy or radiculopathy, cervical region: Secondary | ICD-10-CM | POA: Diagnosis not present

## 2022-10-29 LAB — BASIC METABOLIC PANEL
Anion gap: 9 (ref 5–15)
Anion gap: 10 (ref 5–15)
BUN: 8 mg/dL (ref 8–23)
BUN: 8 mg/dL (ref 8–23)
CO2: 24 mmol/L (ref 22–32)
CO2: 23 mmol/L (ref 22–32)
Calcium: 8.6 mg/dL — ABNORMAL LOW (ref 8.9–10.3)
Calcium: 8.6 mg/dL — ABNORMAL LOW (ref 8.9–10.3)
Chloride: 105 mmol/L (ref 98–111)
Chloride: 104 mmol/L (ref 98–111)
Creatinine, Ser: 0.89 mg/dL (ref 0.61–1.24)
Creatinine, Ser: 1.04 mg/dL (ref 0.61–1.24)
GFR, Estimated: >60 mL/min (ref >60)
GFR, Estimated: >60 mL/min (ref >60)
Glucose, Bld: 140 mg/dL — ABNORMAL HIGH (ref 70–99)
Glucose, Bld: 267 mg/dL — ABNORMAL HIGH (ref 70–99)
Potassium: 3.5 mmol/L (ref 3.5–5.1)
Potassium: 3.8 mmol/L (ref 3.5–5.1)
Sodium: 138 mmol/L (ref 135–145)
Sodium: 137 mmol/L (ref 135–145)

## 2022-10-29 LAB — GLUCOSE, CAPILLARY
Glucose-Capillary: 163 mg/dL — ABNORMAL HIGH (ref 70–99)
Glucose-Capillary: 203 mg/dL — ABNORMAL HIGH (ref 70–99)

## 2022-10-29 LAB — MAGNESIUM
Magnesium: 1.9 mg/dL (ref 1.7–2.4)
Magnesium: 1.9 mg/dL (ref 1.7–2.4)

## 2022-10-29 LAB — HEMOGLOBIN A1C
Hgb A1c MFr Bld: 6.9 % — ABNORMAL HIGH (ref 4.8–5.6)
Mean Plasma Glucose: 151 mg/dL

## 2022-10-29 MED ORDER — LOSARTAN POTASSIUM 100 MG PO TABS: 100.0000 mg | ORAL_TABLET | Freq: Every morning | ORAL | Status: DC

## 2022-10-29 MED ORDER — ASPIRIN 81 MG PO CHEW
81.0000 mg | CHEWABLE_TABLET | Freq: Every day | ORAL | 2 refills | Status: DC
  Filled 2022-10-29: qty 30, 30d supply, fill #0

## 2022-10-29 MED ORDER — POTASSIUM CHLORIDE CRYS ER 20 MEQ PO TBCR
40.0000 meq | EXTENDED_RELEASE_TABLET | Freq: Once | ORAL | Status: AC
  Administered 2022-10-29: 40 meq via ORAL
  Filled 2022-10-29: qty 2

## 2022-10-29 MED ORDER — IOHEXOL 350 MG/ML SOLN
75.0000 mL | Freq: Once | INTRAVENOUS | Status: AC | PRN
  Administered 2022-10-29: 75 mL via INTRAVENOUS

## 2022-10-29 MED ORDER — ASPIRIN 81 MG PO CHEW
81.0000 mg | CHEWABLE_TABLET | Freq: Every day | ORAL | 0 refills | Status: DC
  Filled 2022-10-29: qty 27, 27d supply, fill #0

## 2022-10-29 MED ORDER — CLOPIDOGREL BISULFATE 75 MG PO TABS
75.0000 mg | ORAL_TABLET | Freq: Every day | ORAL | 0 refills | Status: DC
  Filled 2022-10-29: qty 27, 27d supply, fill #0

## 2022-10-29 MED ORDER — CLOPIDOGREL BISULFATE 75 MG PO TABS
75.0000 mg | ORAL_TABLET | Freq: Every day | ORAL | 3 refills | Status: DC
  Filled 2022-10-29: qty 30, 30d supply, fill #0

## 2022-10-29 MED ORDER — AMLODIPINE BESYLATE 10 MG PO TABS: 10.0000 mg | ORAL_TABLET | Freq: Every morning | ORAL | Status: DC

## 2022-10-29 NOTE — Consult Note (Signed)
Neurology Consultation  Reason for Consult: Stroke on MRI  Referring Physician: Thedore Mins  CC: Numbness  History is obtained from:Patient and Chart Review  HPI: Aaron Martinez is a 61 y.o. male with medical history significant of diabetes mellitus type 2, hypertension, tobacco use disorder, hyperlipidemia, and more presents to the ED with a chief complaint of numbness.  Patient reports he was sitting watching TV when he noticed left arm, hand, face numbness.  He reports it was sudden onset.  He has never had anything like it before.  He has had where his lips have been done in the past, but that was from a kind of Chapstick he was using.  Patient reports that while he has had this numbness, he has not had any dysphagia, change in vision, change in hearing, dysarthria.  He reports he had no ataxia as well.  He does not think his left foot has been affected, there was one moment where it felt numb, but he attributes improvement to positional change.  Patient has otherwise been in his normal state of health.  He has had no chest pain, palpitations, headaches, infectious symptoms, or other complaints.    ROS: Full ROS was performed and is negative except as noted in the HPI.   Past Medical History:  Diagnosis Date   Diabetes mellitus without complication (HCC)    Hypertension      Family History  Problem Relation Age of Onset   Emphysema Mother    COPD Mother    Alzheimer's disease Father    Heart disease Father    Arrhythmia Brother     Social History:   reports that he has been smoking cigarettes. His smokeless tobacco use includes chew. He reports current alcohol use of about 3.0 standard drinks of alcohol per week. He reports that he does not use drugs.  Medications  Current Facility-Administered Medications:    acetaminophen (TYLENOL) tablet 650 mg, 650 mg, Oral, Q4H PRN **OR** acetaminophen (TYLENOL) 160 MG/5ML solution 650 mg, 650 mg, Per Tube, Q4H PRN **OR** acetaminophen (TYLENOL)  suppository 650 mg, 650 mg, Rectal, Q4H PRN, Zierle-Ghosh, Asia B, DO   aspirin chewable tablet 81 mg, 81 mg, Oral, Daily, Zierle-Ghosh, Asia B, DO, 81 mg at 10/29/22 0901   atorvastatin (LIPITOR) tablet 20 mg, 20 mg, Oral, Daily, Zierle-Ghosh, Asia B, DO, 20 mg at 10/29/22 0901   clopidogrel (PLAVIX) tablet 75 mg, 75 mg, Oral, Daily, Leroy Sea, MD, 75 mg at 10/29/22 0901   heparin injection 5,000 Units, 5,000 Units, Subcutaneous, Q8H, Zierle-Ghosh, Asia B, DO, 5,000 Units at 10/29/22 0859   insulin aspart (novoLOG) injection 0-15 Units, 0-15 Units, Subcutaneous, TID WC, Zierle-Ghosh, Asia B, DO, 3 Units at 10/29/22 0858   insulin aspart (novoLOG) injection 0-5 Units, 0-5 Units, Subcutaneous, QHS, Zierle-Ghosh, Asia B, DO, 3 Units at 10/28/22 2223   insulin glargine-yfgn (SEMGLEE) injection 7 Units, 7 Units, Subcutaneous, QHS, Zierle-Ghosh, Asia B, DO, 7 Units at 10/28/22 2313   potassium chloride SA (KLOR-CON M) CR tablet 40 mEq, 40 mEq, Oral, Once, Singh, Stanford Scotland, MD   senna-docusate (Senokot-S) tablet 1 tablet, 1 tablet, Oral, QHS PRN, Zierle-Ghosh, Asia B, DO  Exam: Current vital signs: BP (!) 154/78 (BP Location: Right Arm)   Pulse (!) 54   Temp 98.5 F (36.9 C) (Oral)   Resp 14   Ht 6' (1.829 m)   Wt 77.4 kg   SpO2 100%   BMI 23.14 kg/m  Vital signs in last 24 hours: Temp:  [  97.9 F (36.6 C)-98.5 F (36.9 C)] 98.5 F (36.9 C) (11/27 0759) Pulse Rate:  [52-67] 54 (11/27 0759) Resp:  [14-20] 14 (11/27 0759) BP: (131-154)/(72-87) 154/78 (11/27 0759) SpO2:  [97 %-100 %] 100 % (11/27 0759) Weight:  [77.4 kg] 77.4 kg (11/26 1621)  GENERAL: Awake, alert in NAD HEENT: - Normocephalic and atraumatic, dry mm, no LN++, no Thyromegally LUNGS - Clear to auscultation bilaterally with no wheezes CV - S1S2 RRR, no m/r/g, equal pulses bilaterally. ABDOMEN - Soft, nontender, nondistended with normoactive BS Ext: warm, well perfused, intact peripheral pulses, no edema  NEURO:   Mental Status: AA&Ox3  Language: speech is clear.  Naming, repetition, fluency, and comprehension intact. Cranial Nerves: PERRL mm/brisk. EOMI, visual fields full, no facial asymmetry, facial sensation intact, hearing intact, tongue/uvula/soft palate midline, normal sternocleidomastoid and trapezius muscle strength. No evidence of tongue atrophy or fasciculations Motor:  RUE 5/5  LUE5/5 RLE5/5  LLE 5/5 Tone: is normal and bulk is normal Sensation- diminished on the left face and arm. Normal sensation in lower extremities bilaterally Coordination: FTN intact bilaterally, no ataxia in BLE. Gait- deferred  NIHSS components Score: Comment  1a Level of Conscious 0[x]  1[]  2[]  3[]         1b LOC Questions 0[x]  1[]  2[]           1c LOC Commands 0[x]  1[]  2[]           2 Best Gaze 0[x]  1[]  2[]           3 Visual 0[x]  1[]  2[]  3[]         4 Facial Palsy 0[x]  1[]  2[]  3[]         5a Motor Arm - left 0[x]  1[]  2[]  3[]  4[]  UN[]     5b Motor Arm - Right 0[x]  1[]  2[]  3[]  4[]  UN[]     6a Motor Leg - Left 0[x]  1[]  2[]  3[]  4[]  UN[]     6b Motor Leg - Right 0[x]  1[]  2[]  3[]  4[]  UN[]     7 Limb Ataxia 0[x]  1[]  2[]  3[]  UN[]       8 Sensory 0[]  1[x]  2[]  UN[]         9 Best Language 0[x]  1[]  2[]  3[]         10 Dysarthria 0[x]  1[]  2[]  UN[]         11 Extinct. and Inattention 0[x]  1[]  2[]           TOTAL: 1        Labs I have reviewed labs in epic and the results pertinent to this consultation are:  CBC    Component Value Date/Time   WBC 11.2 (H) 10/28/2022 0333   RBC 4.86 10/28/2022 0333   HGB 14.8 10/28/2022 0333   HCT 41.9 10/28/2022 0333   PLT 235 10/28/2022 0333   MCV 86.2 10/28/2022 0333   MCH 30.5 10/28/2022 0333   MCHC 35.3 10/28/2022 0333   RDW 14.2 10/28/2022 0333   LYMPHSABS 2.4 10/28/2022 0038   MONOABS 1.0 10/28/2022 0038   EOSABS 0.3 10/28/2022 0038   BASOSABS 0.1 10/28/2022 0038    CMP     Component Value Date/Time   NA 137 10/29/2022 0846   K 3.8 10/29/2022 0846   CL 104 10/29/2022  0846   CO2 23 10/29/2022 0846   GLUCOSE 267 (H) 10/29/2022 0846   BUN 8 10/29/2022 0846   CREATININE 1.04 10/29/2022 0846   CALCIUM 8.6 (L) 10/29/2022 0846   PROT 7.3 10/28/2022 0333   ALBUMIN 4.0 10/28/2022 0333   AST 20 10/28/2022  0333   ALT 22 10/28/2022 0333   ALKPHOS 62 10/28/2022 0333   BILITOT 0.4 10/28/2022 0333   GFRNONAA >60 10/29/2022 0846    Lipid Panel     Component Value Date/Time   CHOL 146 10/28/2022 0333   TRIG 168 (H) 10/28/2022 0333   HDL 51 10/28/2022 0333   CHOLHDL 2.9 10/28/2022 0333   VLDL 34 10/28/2022 0333   LDLCALC 61 10/28/2022 0333     Imaging I have reviewed the images obtained:  CTA Head and Neck  1. Small acute infarct in the right thalamus as seen on prior MRI. No new acute intracranial pathology. 2. Mild calcified plaque at the right carotid bifurcation and bilateral carotid siphons without significant stenosis or occlusion. Otherwise unremarkable vasculature of the head and neck.  MRI examination of the brain 1. Positive for a small acute lacunar infarct in the Right thalamus. No associated hemorrhage or mass effect. 2. Otherwise negative for age noncontrast MRI appearance of the brain. 3. Mild to moderate paranasal sinus inflammation.  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 Stroke work up is in progress. He has been cleared by PT, OT, and ST and is not going to need any follow up therapy.   Recommendations: - CTA head and neck pending - DAPT therapy with ASA and plavix for 3 weeks and then plavix alone  - Recommend Zio patch/30 day heart monitor  - Continue janumet for diabetes (Ha1C 6.9) - Continue atorvastatin 10mg   (LDL 61) - Smoking cessation is recommended and has been discussed with patient.  - Follow up with [facility] in 8-12 weeks - Follow up with PCP in 1-2  weeks   Patient seen and examined by NP/APP with MD. MD to update note as needed.   10-12, DNP, FNP-BC [facility] Pager: [phone]  Attending Neurologist's note:  I personally saw this patient, gathering history, performing a full neurologic examination, reviewing relevant labs, personally reviewing relevant imaging including MRI brain and CTA, and formulated the assessment and plan, adding the note above for completeness and clarity to accurately reflect my thoughts  [phone] MD-PhD [facility] [phone]

## 2022-10-29 NOTE — Plan of Care (Signed)
Problem: Education: Goal: Knowledge of disease or condition will improve Outcome: Adequate for Discharge Goal: Knowledge of secondary prevention will improve (MUST DOCUMENT ALL) Outcome: Adequate for Discharge Goal: Knowledge of patient specific risk factors will improve (Mark N/A or DELETE if not current risk factor) Outcome: Adequate for Discharge   Problem: Ischemic Stroke/TIA Tissue Perfusion: Goal: Complications of ischemic stroke/TIA will be minimized Outcome: Adequate for Discharge   Problem: Coping: Goal: Will verbalize positive feelings about self Outcome: Adequate for Discharge Goal: Will identify appropriate support needs Outcome: Adequate for Discharge   Problem: Health Behavior/Discharge Planning: Goal: Ability to manage health-related needs will improve Outcome: Adequate for Discharge Goal: Goals will be collaboratively established with patient/family Outcome: Adequate for Discharge   Problem: Self-Care: Goal: Ability to participate in self-care as condition permits will improve Outcome: Adequate for Discharge Goal: Verbalization of feelings and concerns over difficulty with self-care will improve Outcome: Adequate for Discharge Goal: Ability to communicate needs accurately will improve Outcome: Adequate for Discharge   Problem: Nutrition: Goal: Risk of aspiration will decrease Outcome: Adequate for Discharge Goal: Dietary intake will improve Outcome: Adequate for Discharge   Problem: Education: Goal: Ability to describe self-care measures that may prevent or decrease complications (Diabetes Survival Skills Education) will improve Outcome: Adequate for Discharge Goal: Individualized Educational Video(s) Outcome: Adequate for Discharge   Problem: Coping: Goal: Ability to adjust to condition or change in health will improve Outcome: Adequate for Discharge   Problem: Fluid Volume: Goal: Ability to maintain a balanced intake and output will  improve Outcome: Adequate for Discharge   Problem: Health Behavior/Discharge Planning: Goal: Ability to identify and utilize available resources and services will improve Outcome: Adequate for Discharge Goal: Ability to manage health-related needs will improve Outcome: Adequate for Discharge   Problem: Metabolic: Goal: Ability to maintain appropriate glucose levels will improve Outcome: Adequate for Discharge   Problem: Nutritional: Goal: Maintenance of adequate nutrition will improve Outcome: Adequate for Discharge Goal: Progress toward achieving an optimal weight will improve Outcome: Adequate for Discharge   Problem: Skin Integrity: Goal: Risk for impaired skin integrity will decrease Outcome: Adequate for Discharge   Problem: Tissue Perfusion: Goal: Adequacy of tissue perfusion will improve Outcome: Adequate for Discharge   Problem: Education: Goal: Knowledge of General Education information will improve Description: Including pain rating scale, medication(s)/side effects and non-pharmacologic comfort measures Outcome: Adequate for Discharge   Problem: Health Behavior/Discharge Planning: Goal: Ability to manage health-related needs will improve Outcome: Adequate for Discharge   Problem: Clinical Measurements: Goal: Ability to maintain clinical measurements within normal limits will improve Outcome: Adequate for Discharge Goal: Will remain free from infection Outcome: Adequate for Discharge Goal: Diagnostic test results will improve Outcome: Adequate for Discharge Goal: Respiratory complications will improve Outcome: Adequate for Discharge Goal: Cardiovascular complication will be avoided Outcome: Adequate for Discharge   Problem: Activity: Goal: Risk for activity intolerance will decrease Outcome: Adequate for Discharge   Problem: Nutrition: Goal: Adequate nutrition will be maintained Outcome: Adequate for Discharge   Problem: Coping: Goal: Level of  anxiety will decrease Outcome: Adequate for Discharge   Problem: Elimination: Goal: Will not experience complications related to bowel motility Outcome: Adequate for Discharge Goal: Will not experience complications related to urinary retention Outcome: Adequate for Discharge   Problem: Pain Managment: Goal: General experience of comfort will improve Outcome: Adequate for Discharge   Problem: Safety: Goal: Ability to remain free from injury will improve Outcome: Adequate for Discharge   Problem: Skin Integrity: Goal: Risk for   impaired skin integrity will decrease Outcome: Adequate for Discharge   

## 2022-10-29 NOTE — Discharge Instructions (Signed)
Follow with Primary MD Aaron Alf, FNP in 7 days   Get CBC, CMP, magnesium, hemoglobin A1c, lipid panel-  checked next visit with your primary MD     Activity: As tolerated with Full fall precautions use walker/cane & assistance as needed  Disposition Home   Diet: Heart Healthy  Low Carb, check CBGs q. [facility]  Special Instructions: If you have smoked or chewed Tobacco  in the last 2 yrs please stop smoking, stop any regular Alcohol  and or any Recreational drug use.  On your next visit with your primary care physician please Get Medicines reviewed and adjusted.  Please request your Prim.MD to go over all Hospital Tests and Procedure/Radiological results at the follow up, please get all Hospital records sent to your Prim MD by signing hospital release before you go home.  If you experience worsening of your admission symptoms, develop shortness of breath, life threatening emergency, suicidal or homicidal thoughts you must seek medical attention immediately by calling 911 or calling your MD immediately  if symptoms less severe.  You Must read complete instructions/literature along with all the possible adverse reactions/side effects for all the Medicines you take and that have been prescribed to you. Take any new Medicines after you have completely understood and accpet all the possible adverse reactions/side effects.

## 2022-10-29 NOTE — Progress Notes (Signed)
Discharged to home after IV access removed and discharge instructions given.  Taken to [facility] to pick up meds.

## 2022-10-29 NOTE — Discharge Summary (Addendum)
Aaron Martinez RUE:[id number] DOB: 07-07-1961 DOA: 10/28/2022  PCP: Gearlean Alf, FNP  Admit date: 10/28/2022  Discharge date: 10/29/2022  Admitted From: Home   Disposition:  Home   Recommendations for Outpatient Follow-up:   Follow up with PCP in 1-2 weeks  PCP Please obtain BMP/CBC, 2 view CXR in 1week,  (see Discharge instructions)   PCP Please follow up on the following pending results:    Home Health: None   Equipment/Devices: None  Consultations: Neuro Discharge Condition: Stable    CODE STATUS: Full    Diet Recommendation: Heart Healthy     Chief Complaint  Patient presents with   Numbness     Brief history of present illness from the day of admission and additional interim summary    61 y.o. male with medical history significant of diabetes mellitus type 2, hypertension, tobacco use disorder, hyperlipidemia, and more presents to the ED with a chief complaint of numbness.                                                                    Hospital Course   CVA with small acute lacunar infarct in the Right thalamus - symptoms almost resolved, full stroke workup done, stable A1c, LDL and TTE, CTA head and Neck with mild carotid stenosis, PCP to monitor secondary risk factors & Carotids, counseled to quit smoking, DW CVA team Dr Roda Shutters, 2 weeks of DAPT then Plavix alone, apparently was on ASA at home (not on Med Rec) continue statin and home DM regimen. 30 day event monitor will be done via Cards.     DMII (diabetes mellitus, type 2) (HCC) - Home rx continue   HLD (hyperlipidemia) Continue statin, stable LDL   Hypokalemia replaced   Tobacco dependence Counseled to quit   Discharge diagnosis     Principal Problem:   CVA (cerebral vascular accident) (HCC) Active Problems:   Tobacco  dependence   Hypokalemia   HLD (hyperlipidemia)   DMII (diabetes mellitus, type 2) (HCC)    Discharge instructions    Discharge Instructions     Diet - low sodium heart healthy   Complete by: As directed    Discharge instructions   Complete by: As directed    Follow with Primary MD Gearlean Alf, FNP in 7 days   Get CBC, CMP, magnesium, hemoglobin A1c, lipid panel -  checked next visit with your primary MD     Activity: As tolerated with Full fall precautions use walker/cane & assistance as needed  Disposition Home   Diet: Heart Healthy  Low Carb, check CBGs q. [facility]  Special Instructions: If you have smoked or chewed Tobacco  in the last 2 yrs please stop smoking, stop any regular Alcohol  and or any Recreational drug use.  On your  next visit with your primary care physician please Get Medicines reviewed and adjusted.  Please request your Prim.MD to go over all Hospital Tests and Procedure/Radiological results at the follow up, please get all Hospital records sent to your Prim MD by signing hospital release before you go home.  If you experience worsening of your admission symptoms, develop shortness of breath, life threatening emergency, suicidal or homicidal thoughts you must seek medical attention immediately by calling 911 or calling your MD immediately  if symptoms less severe.  You Must read complete instructions/literature along with all the possible adverse reactions/side effects for all the Medicines you take and that have been prescribed to you. Take any new Medicines after you have completely understood and accpet all the possible adverse reactions/side effects.   Increase activity slowly   Complete by: As directed        Discharge Medications   Allergies as of 10/29/2022   No Known Allergies      Medication List     TAKE these medications    amLODipine 10 MG tablet Commonly known as: NORVASC Take 1 tablet (10 mg total) by mouth in the  morning. Start taking on: October 30, 2022   aspirin 81 MG chewable tablet Chew 1 tablet (81 mg total) by mouth daily. Start taking on: October 30, 2022   atorvastatin 10 MG tablet Commonly known as: LIPITOR Take 10 mg by mouth every Monday, Wednesday, and Friday.   clopidogrel 75 MG tablet Commonly known as: PLAVIX Take 1 tablet (75 mg total) by mouth daily. Start taking on: October 30, 2022   hydrochlorothiazide 25 MG tablet Commonly known as: HYDRODIURIL Take 25 mg by mouth in the morning.   Janumet 50-1000 MG tablet Generic drug: sitaGLIPtin-metformin Take 1 tablet by mouth 2 (two) times daily.   losartan 100 MG tablet Commonly known as: COZAAR Take 1 tablet (100 mg total) by mouth in the morning. Start taking on: October 31, 2022 What changed: These instructions start on October 31, 2022. If you are unsure what to do until then, ask your doctor or other care provider.   tadalafil 5 MG tablet Commonly known as: CIALIS Take 5 mg by mouth in the morning.   Evaristo Bury FlexTouch 100 UNIT/ML FlexTouch Pen Generic drug: insulin degludec Inject 10 Units into the skin daily.         Follow-up Information     Gearlean Alf, FNP. Schedule an appointment as soon as possible for a visit in 1 week(s).   Specialty: Family Medicine Contact information: [address] [place] [address] [phone]         [facility]. Schedule an appointment as soon as possible for a visit in 1 month(s).   Why: CVA Contact information: [address]     [address] [place] [address] [phone]                Major procedures and Radiology Reports - PLEASE review detailed and final reports thoroughly  -       CT ANGIO HEAD NECK W WO CM  Result Date: 10/29/2022 CLINICAL DATA:  TIA. EXAM: CT ANGIOGRAPHY HEAD AND NECK TECHNIQUE: Multidetector CT imaging of the head and neck was performed using the standard protocol during  bolus administration of intravenous contrast. Multiplanar CT image reconstructions and MIPs were obtained to evaluate the vascular anatomy. Carotid stenosis measurements (when applicable) are obtained utilizing NASCET criteria, using the distal internal carotid diameter as the denominator. RADIATION  DOSE REDUCTION: This exam was performed according to the departmental dose-optimization program which includes automated exposure control, adjustment of the mA and/or kV according to patient size and/or use of iterative reconstruction technique. CONTRAST:  50mL OMNIPAQUE IOHEXOL 350 MG/ML SOLN COMPARISON:  Brain MRI obtained earlier the same day, CT head 1 day prior. FINDINGS: CT HEAD FINDINGS Brain: There is a small acute infarct in the right thalamus common better seen on same day MRI. There is no other evidence of acute infarct. There is no acute intracranial hemorrhage or extra-axial fluid collection. Background parenchymal volume is normal. The ventricles are normal in size. Gray-white differentiation is preserved. There is no mass lesion. There is no mass effect or midline shift. Vascular: See below. Skull: Normal. Negative for fracture or focal lesion. Sinuses/Orbits: Mucosal thickening is again seen throughout the paranasal sinuses. The globes and orbits are unremarkable. Other: None. Review of the MIP images confirms the above findings CTA NECK FINDINGS Aortic arch: The imaged aortic arch is normal. The origins of the major branch vessels are patent. The subclavian arteries are patent to the level imaged. Right carotid system: The right common, internal, and external carotid arteries are patent, with mild plaque at the bifurcation but no significant stenosis or occlusion. There is no dissection or aneurysm. Left carotid system: The left common, internal, and external carotid arteries are patent, with no hemodynamically significant stenosis or occlusion. There is no dissection or aneurysm. Vertebral arteries: The  vertebral arteries are patent, without hemodynamically significant stenosis or occlusion. There is no dissection or aneurysm. Skeleton: There is mild degenerative change of the cervical spine. There is no acute osseous abnormality or suspicious osseous lesion. There is no visible canal hematoma. Other neck: The soft tissues of the neck are unremarkable. Upper chest: The imaged lung apices are clear. Review of the MIP images confirms the above findings CTA HEAD FINDINGS Anterior circulation: There is mild calcified plaque in the carotid siphons without significant stenosis. The bilateral MCAs are patent, without proximal stenosis or occlusion. The bilateral ACAs are patent, without proximal stenosis or occlusion. There is no aneurysm or AVM. Posterior circulation: The bilateral V4 segments are patent. The basilar artery is patent. The major cerebellar arteries appear patent. The bilateral PCAs are patent, without proximal stenosis or occlusion. Bilateral communicating arteries are identified. There is no aneurysm or AVM. Venous sinuses: Patent. Anatomic variants: None. Review of the MIP images confirms the above findings IMPRESSION: 1. Small acute infarct in the right thalamus as seen on prior MRI. No new acute intracranial pathology. 2. Mild calcified plaque at the right carotid bifurcation and bilateral carotid siphons without significant stenosis or occlusion. Otherwise unremarkable vasculature of the head and neck. Electronically Signed   By: Lesia Hausen M.D.   On: 10/29/2022 12:37   MR BRAIN WO CONTRAST  Result Date: 10/29/2022 CLINICAL DATA:  61 year old male code stroke presentation overnight. Left side deficit, numbness. EXAM: MRI HEAD WITHOUT CONTRAST TECHNIQUE: Multiplanar, multiecho pulse sequences of the brain and surrounding structures were obtained without intravenous contrast. COMPARISON:  Plain head CT 0057 hours today. FINDINGS: Brain: Small round 5-6 mm focus of diffusion restriction in the  right thalamus on series 5, image 79 (series 8, image 15). Faint T2 and FLAIR hyperintensity. No hemorrhage or mass effect. Underlying cerebral volume normal for age. No other restricted diffusion. No midline shift, mass effect, evidence of mass lesion, ventriculomegaly, extra-axial collection or acute intracranial hemorrhage. Cervicomedullary junction and pituitary are within normal limits. Outside of the  right thalamus normal for age gray and white matter signal. No cortical encephalomalacia or chronic cerebral blood products. Vascular: Major intracranial vascular flow voids are preserved. Skull and upper cervical spine: Negative. Visualized bone marrow signal is within normal limits. Sinuses/Orbits: Negative orbits. Mild to moderate scattered bilateral paranasal sinus mucosal thickening and opacification. Other: Mastoids are clear. Visible internal auditory structures appear normal. Negative visible scalp and face. IMPRESSION: 1. Positive for a small acute lacunar infarct in the Right thalamus. No associated hemorrhage or mass effect. 2. Otherwise negative for age noncontrast MRI appearance of the brain. 3. Mild to moderate paranasal sinus inflammation. Electronically Signed   By: Odessa Fleming M.D.   On: 10/29/2022 07:28   ECHOCARDIOGRAM COMPLETE  Result Date: 10/28/2022    ECHOCARDIOGRAM REPORT   Patient Name:   BURNICE OESTREICHER Date of Exam: 10/28/2022 Medical Rec #:  [id number]    Height:       72.0 in Accession #:    [id number]   Weight:       180.0 lb Date of Birth:  28-May-1961    BSA:          2.037 m Patient Age:    61 years     BP:           129/72 mmHg Patient Gender: M            HR:           61 bpm. [facility] Procedure: 2D Echo, Cardiac Doppler and Color Doppler Indications:    SBE I33.9  History:        Patient has no prior history of Echocardiogram examinations.                 Risk Factors:Hypertension, Diabetes, Dyslipidemia and Current                 Smoker.  Sonographer:    Celesta Gentile RCS Referring Phys: [id number] ASIA B ZIERLE-GHOSH IMPRESSIONS  1. Left ventricular ejection fraction, by estimation, is 60 to 65%. The left ventricle has normal function. The left ventricle has no regional wall motion abnormalities. Left ventricular diastolic parameters were normal.  2. Right ventricular systolic function is normal. The right ventricular size is normal. There is normal pulmonary artery systolic pressure. The estimated right ventricular systolic pressure is 26.7 mmHg.  3. Left atrial size was mildly dilated.  4. The mitral valve is normal in structure. Trivial mitral valve regurgitation. No evidence of mitral stenosis.  5. The aortic valve is normal in structure. Aortic valve regurgitation is not visualized. No aortic stenosis is present.  6. The inferior vena cava is dilated in size with >50% respiratory variability, suggesting right atrial pressure of 8 mmHg. Conclusion(s)/Recommendation(s): No evidence of valvular vegetations on this transthoracic echocardiogram. Consider a transesophageal echocardiogram to exclude infective endocarditis if clinically indicated. FINDINGS  Left Ventricle: Left ventricular ejection fraction, by estimation, is 60 to 65%. The left ventricle has normal function. The left ventricle has no regional wall motion abnormalities. The left ventricular internal cavity size was normal in size. There is  no left ventricular hypertrophy. Left ventricular diastolic parameters were normal. Normal left ventricular filling pressure. Right Ventricle: The right ventricular size is normal. No increase in right ventricular wall thickness. Right ventricular systolic function is normal. There is normal pulmonary artery systolic pressure. The tricuspid regurgitant velocity is 2.16 m/s, and  with an assumed right atrial pressure of 8 mmHg, the estimated right ventricular systolic  pressure is 26.7 mmHg. Left Atrium: Left atrial size was mildly dilated. Right Atrium: Right atrial size was  normal in size. Pericardium: There is no evidence of pericardial effusion. Mitral Valve: The mitral valve is normal in structure. Trivial mitral valve regurgitation. No evidence of mitral valve stenosis. Tricuspid Valve: The tricuspid valve is normal in structure. Tricuspid valve regurgitation is trivial. No evidence of tricuspid stenosis. Aortic Valve: The aortic valve is normal in structure. Aortic valve regurgitation is not visualized. No aortic stenosis is present. Pulmonic Valve: The pulmonic valve was normal in structure. Pulmonic valve regurgitation is not visualized. No evidence of pulmonic stenosis. Aorta: The aortic root is normal in size and structure. Venous: The inferior vena cava is dilated in size with greater than 50% respiratory variability, suggesting right atrial pressure of 8 mmHg. IAS/Shunts: No atrial level shunt detected by color flow Doppler.  LEFT VENTRICLE PLAX 2D LVIDd:         5.80 cm   Diastology LVIDs:         2.90 cm   LV e' medial:    8.38 cm/s LV PW:         0.90 cm   LV E/e' medial:  12.2 LV IVS:        0.90 cm   LV e' lateral:   12.05 cm/s LVOT diam:     2.00 cm   LV E/e' lateral: 8.5 LV SV:         102 LV SV Index:   50 LVOT Area:     3.14 cm  RIGHT VENTRICLE RV S prime:     14.00 cm/s TAPSE (M-mode): 2.8 cm LEFT ATRIUM             Index        RIGHT ATRIUM           Index LA diam:        4.00 cm 1.96 cm/m   RA Area:     18.80 cm LA Vol (A2C):   65.5 ml 32.15 ml/m  RA Volume:   54.10 ml  26.56 ml/m LA Vol (A4C):   73.0 ml 35.83 ml/m LA Biplane Vol: 70.4 ml 34.56 ml/m  AORTIC VALVE LVOT Vmax:   146.00 cm/s LVOT Vmean:  88.200 cm/s LVOT VTI:    0.325 m  AORTA Ao Root diam: 3.80 cm MITRAL VALVE                TRICUSPID VALVE MV Area (PHT): 5.02 cm     TR Peak grad:   18.7 mmHg MV Decel Time: 151 msec     TR Vmax:        216.00 cm/s MV E velocity: 102.00 cm/s MV A velocity: 75.70 cm/s   SHUNTS MV E/A ratio:  1.35         Systemic VTI:  0.32 m                              Systemic Diam: 2.00 cm Rachelle Hora Croitoru MD Electronically signed by Thurmon Fair MD Signature Date/Time: 10/28/2022/12:47:02 PM    Final    CT HEAD CODE STROKE WO CONTRAST  Result Date: 10/28/2022 CLINICAL DATA:  Code stroke. Numbness on left side of lip, progressed to left-side of face in the nose EXAM: CT HEAD WITHOUT CONTRAST TECHNIQUE: Contiguous axial images were obtained from the base of the skull through the vertex without intravenous contrast. RADIATION DOSE REDUCTION: This  exam was performed according to the departmental dose-optimization program which includes automated exposure control, adjustment of the mA and/or kV according to patient size and/or use of iterative reconstruction technique. COMPARISON:  02/15/2004 FINDINGS: Brain: No evidence of acute infarction, hemorrhage, cerebral edema, mass, mass effect, or midline shift. No hydrocephalus or extra-axial collection. Vascular: No hyperdense vessel. Skull: Negative for fracture or focal lesion. Sinuses/Orbits: Mucosal thickening in the ethmoid air cells and right frontal sinus. The orbits are unremarkable. Other: The mastoid air cells are well aerated. ASPECTS [facility]([place] Stroke Program Early CT Score) - Ganglionic level infarction (caudate, lentiform nuclei, internal capsule, insula, M1-M3 cortex): 7 - Supraganglionic infarction (M4-M6 cortex): 3 Total score (0-10 with 10 being normal): 10 IMPRESSION: No evidence of acute intracranial process. ASPECTS is 10. These results were called by telephone at the time of interpretation on 10/28/2022 at 1:06 am to provider DAVID [facility] , who verbally acknowledged these results. Electronically Signed   By: Wiliam KeAlison  Vasan M.D.   On: 10/28/2022 01:06      Today   Subjective    Aaron MuttonJames Martinez today has no headache,no chest abdominal pain,no new weakness tingling or numbness, feels much better wants to go home today. Minimal L arm numbness.   Objective   Blood pressure 138/74, pulse (!) 57, temperature 98.7  F (37.1 C), temperature source Oral, resp. rate 20, height 6' (1.829 m), weight 77.4 kg, SpO2 100 %.   Intake/Output Summary (Last 24 hours) at 10/29/2022 1355 Last data filed at 10/29/2022 0900 Gross per 24 hour  Intake 560 ml  Output --  Net 560 ml    Exam  Awake Alert, No new F.N deficits,    [place].AT,PERRAL Supple Neck,   Symmetrical Chest wall movement, Good air movement bilaterally, CTAB RRR,No Gallops,   +ve B.Sounds, Abd Soft, Non tender,  No Cyanosis, Clubbing or edema    Data Review   Recent Labs  Lab 10/28/22 0038 10/28/22 0333  WBC 10.0 11.2*  HGB 15.2 14.8  HCT 44.0 41.9  PLT 223 235  MCV 86.8 86.2  MCH 30.0 30.5  MCHC 34.5 35.3  RDW 14.2 14.2  LYMPHSABS 2.4  --   MONOABS 1.0  --   EOSABS 0.3  --   BASOSABS 0.1  --     Recent Labs  Lab 10/28/22 0038 10/28/22 0333 10/29/22 0330 10/29/22 0846  NA 135 137 138 137  K 3.1* 3.4* 3.5 3.8  MG  --   --  1.9 1.9  CL 102 106 105 104  CO2 21* 22 24 23   GLUCOSE 265* 168* 140* 267*  BUN 13 12 8 8   CREATININE 0.98 0.84 0.89 1.04  CALCIUM 8.7* 8.7* 8.6* 8.6*  AST 22 20  --   --   ALT 22 22  --   --   ALKPHOS 60 62  --   --   BILITOT 0.3 0.4  --   --   ALBUMIN 4.1 4.0  --   --   INR 0.8  --   --   --   HGBA1C  --  6.9*  --   --    Lab Results  Component Value Date   CHOL 146 10/28/2022   HDL 51 10/28/2022   LDLCALC 61 10/28/2022   TRIG 168 (H) 10/28/2022   CHOLHDL 2.9 10/28/2022    Total Time in preparing paper work, data evaluation and todays exam - 35 minutes  Signature  -    Susa RaringPrashant Elisandro Jarrett M.D on 10/29/2022  at 1:55 PM   -  To page go to [email or web address]

## 2022-10-29 NOTE — TOC Transition Note (Signed)
Transition of Care [facility]) - CM/SW Discharge Note   Patient Details  Name: Aaron Martinez MRN: [id number] Date of Birth: [birth year]  Transition of Care [facility]) CM/SW Contact:  Kermit Balo, RN Phone Number: 10/29/2022, 2:08 PM   Clinical Narrative:    Pt discharging home with self care. No f/u per PT/OT. No DME needs.  Pt has transportation home.   Final next level of care: Home/Self Care Barriers to Discharge: No Barriers Identified   Patient Goals and CMS Choice        Discharge Placement                       Discharge Plan and Services                                     Social Determinants of Health (SDOH) Interventions     Readmission Risk Interventions     No data to display

## 2022-10-29 NOTE — Evaluation (Signed)
Speech Language Pathology Evaluation Patient Details Name: Aaron Martinez MRN: [id number] DOB: [birth year] Today's Date: 10/29/2022 Time: 1010-1031 SLP Time Calculation (min) (ACUTE ONLY): 21 min  Problem List:  Patient Active Problem List   Diagnosis Date Noted   CVA (cerebral vascular accident) ([place]) 10/28/2022   Hypokalemia 10/28/2022   HLD (hyperlipidemia) 10/28/2022   DMII (diabetes mellitus, type 2) ([place]) 10/28/2022   Tobacco dependence 04/22/2020   Benign essential HTN 01/02/2019   Past Medical History:  Past Medical History:  Diagnosis Date   Diabetes mellitus without complication ([place])    Hypertension    Past Surgical History: History reviewed. No pertinent surgical history. HPI:  Aaron Martinez is a 61 y.o. male with medical history significant of diabetes mellitus type 2, hypertension, tobacco use disorder, hyperlipidemia, and more presented  to the ED on 10/28/22 with a chief complaint of L numbness in face and arm.  Patient reported that while he has had this numbness, he has not had any dysphagia, change in vision, change in hearing, dysarthria.  He reports he had no ataxia as well.  He does not think his left foot has been affected, there was one moment where it felt numb, but he attributes improvement to positional change. Passed [facility] swallow screen on 10/28/22.  MRI 10/29/22 revealed small acute lacunar infarct in R thalamus; speech/language assessment generated.   Assessment / Plan / Recommendation Clinical Impression  Pt seen for a speech/language/cognitive assessment with Van Zandt Mental Status Examination (SLUMS) given and a score obtained of 30/30 which indicates adequate cognitive/llinguistic skills for this assessment.  Pt was able to recall objects after a time delay, maintained sustained attention for various tasks and answered questions accurately from a short paragraph without additional cueing required.  Pt's speech was intelligible within complex  conversation without dysarthria/anomia present.  Pt's problem solving and awareness appear WFL.  OME revealed slight L sensory impairment (numbness), but adequate for swallowing/speech purposes.  Graphic expression adequate for clock formation without evidence of spatial disorganization.  Pt able to read environmental signs adequately with glasses in place and no c/o visual changes post-CVA.  No ST indicated at this time.  Thank you for this consult.    SLP Assessment  SLP Recommendation/Assessment: Patient does not need any further Speech Language Pathology Services SLP Visit Diagnosis: Other (comment)    Recommendations for follow up therapy are one component of a multi-disciplinary discharge planning process, led by the attending physician.  Recommendations may be updated based on patient status, additional functional criteria and insurance authorization.    Follow Up Recommendations  No SLP follow up    Assistance Recommended at Discharge  None  Functional Status Assessment Patient has had a recent decline in their functional status and demonstrates the ability to make significant improvements in function in a reasonable and predictable amount of time.  Frequency and Duration  (evaluation only)         SLP Evaluation Cognition  Overall Cognitive Status: Within Functional Limits for tasks assessed Arousal/Alertness: Awake/alert Orientation Level: Oriented X4 Year: 2023 Month: November Day of Week: Correct Attention: Sustained Sustained Attention: Appears intact Memory: Appears intact Awareness: Appears intact Problem Solving: Appears intact Safety/Judgment: Appears intact       Comprehension  Auditory Comprehension Overall Auditory Comprehension: Appears within functional limits for tasks assessed Conversation: Complex Visual Recognition/Discrimination Discrimination: Within Function Limits Reading Comprehension Reading Status: Within funtional limits    Expression  Expression Primary Mode of Expression: Verbal Verbal Expression Overall Verbal Expression:  Appears within functional limits for tasks assessed Level of Generative/Spontaneous Verbalization: Conversation Repetition: No impairment Naming: No impairment Pragmatics: No impairment Non-Verbal Means of Communication: Not applicable Written Expression Dominant Hand: Right Written Expression: Within Functional Limits   Oral / Motor  Oral Motor/Sensory Function Overall Oral Motor/Sensory Function: Within functional limits Motor Speech Overall Motor Speech: Appears within functional limits for tasks assessed Respiration: Within functional limits Phonation: Normal Resonance: Within functional limits Articulation: Within functional limitis Intelligibility: Intelligible Motor Planning: Witnin functional limits Motor Speech Errors: Not applicable            Tressie Stalker, M.S., CCC-SLP 10/29/2022, 10:56 AM

## 2022-10-30 LAB — HEMOGLOBIN A1C
Hgb A1c MFr Bld: 6.9 % — ABNORMAL HIGH (ref 4.8–5.6)
Mean Plasma Glucose: 151 mg/dL

## 2022-11-01 DIAGNOSIS — Z8673 Personal history of transient ischemic attack (TIA), and cerebral infarction without residual deficits: Secondary | ICD-10-CM | POA: Diagnosis not present

## 2022-11-01 DIAGNOSIS — R059 Cough, unspecified: Secondary | ICD-10-CM | POA: Diagnosis not present

## 2022-11-01 DIAGNOSIS — E785 Hyperlipidemia, unspecified: Secondary | ICD-10-CM | POA: Diagnosis not present

## 2022-11-01 DIAGNOSIS — Z720 Tobacco use: Secondary | ICD-10-CM | POA: Diagnosis not present

## 2022-11-01 DIAGNOSIS — I10 Essential (primary) hypertension: Secondary | ICD-10-CM | POA: Diagnosis not present

## 2022-11-01 DIAGNOSIS — E119 Type 2 diabetes mellitus without complications: Secondary | ICD-10-CM | POA: Diagnosis not present

## 2022-11-01 DIAGNOSIS — Z09 Encounter for follow-up examination after completed treatment for conditions other than malignant neoplasm: Secondary | ICD-10-CM | POA: Diagnosis not present

## 2022-11-01 DIAGNOSIS — R918 Other nonspecific abnormal finding of lung field: Secondary | ICD-10-CM | POA: Diagnosis not present

## 2022-11-01 DIAGNOSIS — R0781 Pleurodynia: Secondary | ICD-10-CM | POA: Diagnosis not present

## 2022-11-02 ENCOUNTER — Ambulatory Visit: Payer: BC Managed Care – PPO | Attending: Cardiovascular Disease

## 2022-11-02 DIAGNOSIS — I4891 Unspecified atrial fibrillation: Secondary | ICD-10-CM | POA: Diagnosis not present

## 2022-11-02 DIAGNOSIS — I639 Cerebral infarction, unspecified: Secondary | ICD-10-CM

## 2022-11-05 NOTE — Telephone Encounter (Signed)
Received notification of a critical result on patient, I attempted to contact on 11/04/22- for 6 seconds- run of ventricular tachycardia (rate 177)   Patient has upcoming appointment with Dr.O'Neal on 12/24/22-    I attempted to contact patient to check on him, unable to reach, LVM to call back to discuss further. Left call back number.

## 2022-11-07 ENCOUNTER — Telehealth: Payer: Self-pay

## 2022-11-07 ENCOUNTER — Encounter: Payer: Self-pay | Admitting: Cardiovascular Disease

## 2022-11-07 NOTE — Telephone Encounter (Signed)
Called patient after received another critical result, showed to Dr.O'Neal, advised to keep upcoming appointment in January. Advised patient to call back if any symptoms occur, patient is not having any at this time, and does not remember having any at time of result.   Thanks!

## 2022-11-07 NOTE — Telephone Encounter (Signed)
Received critical monitor for 11/07/22 for nonsustained vtach. Left message on patient's phone to call back. Dr. Flora Lipps viewed rhythm. Rhythm sent for scanning, as well as rhythm on 12/4 (also seen by Dr. Flora Lipps).

## 2022-11-08 ENCOUNTER — Telehealth: Payer: Self-pay | Admitting: Physician Assistant

## 2022-11-08 NOTE — Telephone Encounter (Signed)
Called patient, advised that he is feeling fine, had no issues last night- he did say that his monitor it showing "poor skin contact" which could be related to the monitor not picking up well. He will change this out and let us know if he has any issues.   Thanks!

## 2022-11-08 NOTE — Telephone Encounter (Signed)
Received sign out from overnight fellow stating "syncope" was indicated on his  heart monitor. Monitor company indicated this was associated with NSR. Fellow unable to reach patient.  I attempted to call the patient, call went straight to voicemail. Since monitor revealed NSR, I did not send EMS for welfare check. I will ask Dr. Marylene Buerger staff to reach out to the patient later this morning.

## 2022-11-16 ENCOUNTER — Telehealth: Payer: Self-pay | Admitting: Student in an Organized Health Care Education/Training Program

## 2022-11-16 NOTE — Telephone Encounter (Signed)
I was notified that the patient had a 6 second run of NSVT that was auto- detected by his event monitor. The rhythm broke spontaneously and converted to a NSR. I called the patient and he denies having any symptoms or awareness that he had an arrhythmia. I gave him precautions to come to the ED if he develops symptoms c/f a sustained arrhythmia or chest pain. He verbalized an understanding.

## 2022-11-23 ENCOUNTER — Other Ambulatory Visit (HOSPITAL_COMMUNITY): Payer: Self-pay

## 2022-11-29 ENCOUNTER — Encounter: Payer: Self-pay | Admitting: Cardiovascular Disease

## 2022-12-04 ENCOUNTER — Encounter: Payer: Self-pay | Admitting: Diagnostic Neuroimaging

## 2022-12-04 ENCOUNTER — Ambulatory Visit (INDEPENDENT_AMBULATORY_CARE_PROVIDER_SITE_OTHER): Payer: BC Managed Care – PPO | Admitting: Diagnostic Neuroimaging

## 2022-12-04 VITALS — BP 127/73 | HR 66 | Ht 72.0 in | Wt 185.0 lb

## 2022-12-04 DIAGNOSIS — I6381 Other cerebral infarction due to occlusion or stenosis of small artery: Secondary | ICD-10-CM

## 2022-12-04 NOTE — Patient Instructions (Addendum)
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

## 2022-12-04 NOTE — Progress Notes (Signed)
[facility]  PATIENT: Aaron Martinez DOB: 05-Mar-1961  REFERRING CLINICIAN: Jari Favre, FNP HISTORY FROM: patient  REASON FOR VISIT: new consult    HISTORICAL  CHIEF COMPLAINT:  Chief Complaint  Patient presents with   New Patient (Initial Visit)    Pt in room #7 and alone. Pt here today to discuss his cerebral vascular accident.    HISTORY OF PRESENT ILLNESS:   62 year old male here for evaluation of stroke.  History of diabetes, hypertension, hyperlipidemia, smoking, presented to hospital on 10/28/2022 for left face and arm numbness.  He was admitted for stroke workup.  He was diagnosed with small vessel ischemic infarction in the right thalamus.  Since that time symptoms have essentially resolved.  Has some minimal abnormal sensation in his left hand fingertips.  He has significant on smoking.  He is tolerating medications.  Overall doing well.   REVIEW OF SYSTEMS: Full 14 system review of systems performed and negative with exception of: As per HPI.  ALLERGIES: No Known Allergies  HOME MEDICATIONS: Outpatient Medications Prior to Visit  Medication Sig Dispense Refill   amLODipine (NORVASC) 10 MG tablet Take 1 tablet (10 mg total) by mouth in the morning.     atorvastatin (LIPITOR) 10 MG tablet Take 10 mg by mouth every Monday, Wednesday, and Friday.     clopidogrel (PLAVIX) 75 MG tablet Take 1 tablet (75 mg total) by mouth daily. 30 tablet 3   hydrochlorothiazide (HYDRODIURIL) 25 MG tablet Take 25 mg by mouth in the morning.     insulin degludec (TRESIBA FLEXTOUCH) 100 UNIT/ML FlexTouch Pen Inject 10 Units into the skin daily.     JANUMET 50-1000 MG tablet Take 1 tablet by mouth 2 (two) times daily.     losartan (COZAAR) 100 MG tablet Take 1 tablet (100 mg total) by mouth in the morning.     tadalafil (CIALIS) 5 MG tablet Take 5 mg by mouth in the morning.     aspirin 81 MG chewable tablet Chew 1 tablet (81 mg total) by mouth daily. 27 tablet 0    No facility-administered medications prior to visit.    PAST MEDICAL HISTORY: Past Medical History:  Diagnosis Date   Diabetes mellitus without complication ([place])    Hypertension     PAST SURGICAL HISTORY: History reviewed. No pertinent surgical history.  FAMILY HISTORY: Family History  Problem Relation Age of Onset   Emphysema Mother    COPD Mother    Alzheimer's disease Father    Heart disease Father    Arrhythmia Brother     SOCIAL HISTORY: Social History   Socioeconomic History   Marital status: Significant Other    Spouse name: Not on file   Number of children: Not on file   Years of education: Not on file   Highest education level: Not on file  Occupational History   Not on file  Tobacco Use   Smoking status: Every Day    Types: Cigarettes   Smokeless tobacco: Current    Types: Chew  Vaping Use   Vaping Use: Never used  Substance and Sexual Activity   Alcohol use: Yes    Alcohol/week: 3.0 standard drinks of alcohol    Types: 3 Cans of beer per week   Drug use: Never   Sexual activity: Yes  Other Topics Concern   Not on file  Social History Narrative   Not on file   Social Determinants of Health   Financial Resource Strain: Not on file  Food Insecurity: No Food Insecurity (10/28/2022)   Hunger Vital Sign    Worried About Running Out of Food in the Last Year: Never true    Ran Out of Food in the Last Year: Never true  Transportation Needs: No Transportation Needs (10/28/2022)   PRAPARE - Hydrologist (Medical): No    Lack of Transportation (Non-Medical): No  Physical Activity: Not on file  Stress: Not on file  Social Connections: Not on file  Intimate Partner Violence: Not At Risk (10/28/2022)   Humiliation, Afraid, Rape, and Kick questionnaire    Fear of Current or Ex-Partner: No    Emotionally Abused: No    Physically Abused: No    Sexually Abused: No     PHYSICAL EXAM  GENERAL  EXAM/CONSTITUTIONAL: Vitals:  Vitals:   12/04/22 0945  BP: 127/73  Pulse: 66  Weight: 185 lb (83.9 kg)  Height: 6' (1.829 m)   Body mass index is 25.09 kg/m. Wt Readings from Last 3 Encounters:  12/04/22 185 lb (83.9 kg)  10/28/22 170 lb 10.2 oz (77.4 kg)   Patient is in no distress; well developed, nourished and groomed; neck is supple  CARDIOVASCULAR: Examination of carotid arteries is normal; no carotid bruits Regular rate and rhythm, no murmurs Examination of peripheral vascular system by observation and palpation is normal  EYES: Ophthalmoscopic exam of optic discs and posterior segments is normal; no papilledema or hemorrhages No results found.  MUSCULOSKELETAL: Gait, strength, tone, movements noted in Neurologic exam below  NEUROLOGIC: MENTAL STATUS:      No data to display         awake, alert, oriented to person, place and time recent and remote memory intact normal attention and concentration language fluent, comprehension intact, naming intact fund of knowledge appropriate  CRANIAL NERVE:  2nd - no papilledema on fundoscopic exam 2nd, 3rd, 4th, 6th - pupils equal and reactive to light, visual fields full to confrontation, extraocular muscles intact, no nystagmus 5th - facial sensation symmetric 7th - facial strength symmetric 8th - hearing intact 9th - palate elevates symmetrically, uvula midline 11th - shoulder shrug symmetric 12th - tongue protrusion midline  MOTOR:  normal bulk and tone, full strength in the BUE, BLE  SENSORY:  normal and symmetric to light touch  COORDINATION:  finger-nose-finger, fine finger movements normal  REFLEXES:  deep tendon reflexes present and symmetric  GAIT/STATION:  narrow based gait     DIAGNOSTIC DATA (LABS, IMAGING, TESTING) - I reviewed patient records, labs, notes, testing and imaging myself where available.  Lab Results  Component Value Date   WBC 11.2 (H) 10/28/2022   HGB 14.8 10/28/2022    HCT 41.9 10/28/2022   MCV 86.2 10/28/2022   PLT 235 10/28/2022      Component Value Date/Time   NA 137 10/29/2022 0846   K 3.8 10/29/2022 0846   CL 104 10/29/2022 0846   CO2 23 10/29/2022 0846   GLUCOSE 267 (H) 10/29/2022 0846   BUN 8 10/29/2022 0846   CREATININE 1.04 10/29/2022 0846   CALCIUM 8.6 (L) 10/29/2022 0846   PROT 7.3 10/28/2022 0333   ALBUMIN 4.0 10/28/2022 0333   AST 20 10/28/2022 0333   ALT 22 10/28/2022 0333   ALKPHOS 62 10/28/2022 0333   BILITOT 0.4 10/28/2022 0333   GFRNONAA >60 10/29/2022 0846   Lab Results  Component Value Date   CHOL 146 10/28/2022   HDL 51 10/28/2022   LDLCALC 61 10/28/2022   TRIG  168 (H) 10/28/2022   CHOLHDL 2.9 10/28/2022   Lab Results  Component Value Date   HGBA1C 6.9 (H) 10/29/2022   No results found for: "VITAMINB12" No results found for: "TSH"   10/29/2022 CTA Head and Neck  1. Small acute infarct in the right thalamus as seen on prior MRI. No new acute intracranial pathology. 2. Mild calcified plaque at the right carotid bifurcation and bilateral carotid siphons without significant stenosis or occlusion. Otherwise unremarkable vasculature of the head and neck.   10/29/2022 MRI brain [I reviewed images myself and agree with interpretation. -VRP]  1. Positive for a small acute lacunar infarct in the Right thalamus. No associated hemorrhage or mass effect. 2. Otherwise negative for age noncontrast MRI appearance of the brain. 3. Mild to moderate paranasal sinus inflammation.   10/29/22 Echocardiogram - Mild dilation of left atria - EF 60-65%    ASSESSMENT AND PLAN  62 y.o. year old male here with:   Dx:  1. 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    , MD 12/04/2022, 10:35 AM Certified in Neurology, Neurophysiology and Neuroimaging  [facility] [address] [place], [place] [phone]

## 2022-12-23 NOTE — Progress Notes (Signed)
Cardiology Office Note:   Date:  12/24/2022  NAME:  Aaron Martinez    MRN: [id number] DOB:  11-03-1961   PCP:  Gearlean Alf, FNP  Cardiologist:  None  Electrophysiologist:  None   Referring MD: Gearlean Alf, FNP   Chief Complaint  Patient presents with   Cerebrovascular Accident    History of Present Illness:   Aaron Martinez is a 62 y.o. male with a hx of CVA, HTN, DM, HLD who is being seen today for the evaluation of CVA at the request of Gearlean Alf, FNP.  Recently had a stroke in November 2023.  This is a lacunar infarct which is due to small vessel disease.  30-day monitor with no arrhythmia.  He did have brief nonsustained ventricular tachycardia.  No symptoms.  EKG shows right bundle branch block.  Question aberrant conduction.  Echo was normal in the hospital.  No chest pain or trouble breathing.  LDL at goal.  He is diabetic.  Working on this.  Still an every day smoker.  Has smoked for 40 years.  He was smoking 1 pack/day.  Down to 1/2 pack/day.  We did stress the importance of smoking cessation.  I recommended he quit immediately.  His blood pressure is well-controlled.  He is separated.  He reports he has 2 children.  He works at [employer] in [employer].  He is walking 10,000-15,000 steps per day.  No chest pains or trouble breathing.  CV exam unremarkable.  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 HTN 3. DM -A1c 6.9 4. HLD -T chol 146, HDL 51, LDL 61, TG 168 5. Non-sustained ventricular tachycardia  -brief ~8 seconds, no symptoms  6. RBBB  Past Medical History: Past Medical History:  Diagnosis Date   Diabetes mellitus without complication (HCC)    Hypertension     Past Surgical History: Past Surgical History:  Procedure Laterality Date   APPENDECTOMY      Current Medications: Current Meds  Medication Sig   amLODipine (NORVASC) 10 MG tablet Take 1 tablet (10 mg total) by mouth in the morning.   atorvastatin (LIPITOR) 10  MG tablet Take 10 mg by mouth every Monday, Wednesday, and Friday.   clopidogrel (PLAVIX) 75 MG tablet Take 1 tablet (75 mg total) by mouth daily.   hydrochlorothiazide (HYDRODIURIL) 25 MG tablet Take 25 mg by mouth in the morning.   insulin degludec (TRESIBA FLEXTOUCH) 100 UNIT/ML FlexTouch Pen Inject 10 Units into the skin daily.   JANUMET 50-1000 MG tablet Take 1 tablet by mouth 2 (two) times daily.   losartan (COZAAR) 100 MG tablet Take 1 tablet (100 mg total) by mouth in the morning.   tadalafil (CIALIS) 5 MG tablet Take 5 mg by mouth in the morning.     Allergies:    Patient has no known allergies.   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[employer] [place]  Tobacco Use   Smoking status: Every Day    Packs/day: 0.50    Years: 40.00    Total pack years: 20.00    Types: Cigarettes   Smokeless tobacco: Current    Types: Chew  Vaping Use   Vaping Use: Never used  Substance and Sexual Activity  Alcohol use: Yes    Alcohol/week: 3.0 standard drinks of alcohol    Types: 3 Cans of beer per week   Drug use: Never   Sexual activity: Yes  Other Topics Concern   Not on file  Social History Narrative   Not on file   Social Determinants of Health   Financial Resource Strain: Not on file  Food Insecurity: No Food Insecurity (10/28/2022)   Hunger Vital Sign    Worried About Running Out of Food in the Last Year: Never true    Ran Out of Food in the Last Year: Never true  Transportation Needs: No Transportation Needs (10/28/2022)   PRAPARE - Hydrologist (Medical): No    Lack of Transportation (Non-Medical): No  Physical Activity: Not on file  Stress: Not on file  Social Connections: Not on file     Family  History: The patient's family history includes Alzheimer's disease in his father; Arrhythmia in his brother; COPD in his mother; Diabetes in his father; Emphysema in his mother; Heart disease in his father.  ROS:   All other ROS reviewed and negative. Pertinent positives noted in the HPI.     EKGs/Labs/Other Studies Reviewed:   The following studies were personally reviewed by me today:  EKG:  EKG is ordered today.  The ekg ordered today demonstrates normal sinus rhythm heart rate 64, right bundle branch block, and was personally reviewed by me.   30-day monitor 12/2022 Impression: Brief non-sustained ventricular tachycardia was present (6 episodes in 21 days, longest duration 8 seconds) that is benign.  No atrial fibrillation.  Rare ectopy.   TTE 10/2022    1. Left ventricular ejection fraction, by estimation, is 60 to 65%. The  left ventricle has normal function. The left ventricle has no regional  wall motion abnormalities. Left ventricular diastolic parameters were  normal.   2. Right ventricular systolic function is normal. The right ventricular  size is normal. There is normal pulmonary artery systolic pressure. The  estimated right ventricular systolic pressure is 38.1 mmHg.   3. Left atrial size was mildly dilated.   4. The mitral valve is normal in structure. Trivial mitral valve  regurgitation. No evidence of mitral stenosis.   5. The aortic valve is normal in structure. Aortic valve regurgitation is  not visualized. No aortic stenosis is present.   6. The inferior vena cava is dilated in size with >50% respiratory  variability, suggesting right atrial pressure of 8 mmHg.   Brain MRI 10/2022 1. Positive for a small acute lacunar infarct in the Right thalamus. No associated hemorrhage or mass effect. 2. Otherwise negative for age noncontrast MRI appearance of the brain. 3. Mild to moderate paranasal sinus inflammation  Recent Labs: 10/28/2022: ALT 22; Hemoglobin  14.8; Platelets 235 10/29/2022: BUN 8; Creatinine, Ser 1.04; Magnesium 1.9; Potassium 3.8; Sodium 137   Recent Lipid Panel    Component Value Date/Time   CHOL 146 10/28/2022 0333   TRIG 168 (H) 10/28/2022 0333   HDL 51 10/28/2022 0333   CHOLHDL 2.9 10/28/2022 0333   VLDL 34 10/28/2022 0333   LDLCALC 61 10/28/2022 0333    Physical Exam:   VS:  BP 130/78   Pulse 64   Ht 6' (1.829 m)   Wt 187 lb 6.4 oz (85 kg)   SpO2 98%   BMI 25.42 kg/m    Wt Readings from Last 3 Encounters:  12/24/22 187 lb 6.4 oz (85 kg)  12/04/22  185 lb (83.9 kg)  10/28/22 170 lb 10.2 oz (77.4 kg)    General: Well nourished, well developed, in no acute distress Head: Atraumatic, normal size  Eyes: PEERLA, EOMI  Neck: Supple, no JVD Endocrine: No thryomegaly Cardiac: Normal S1, S2; RRR; no murmurs, rubs, or gallops Lungs: Clear to auscultation bilaterally, no wheezing, rhonchi or rales  Abd: Soft, nontender, no hepatomegaly  Ext: No edema, pulses 2+ Musculoskeletal: No deformities, BUE and BLE strength normal and equal Skin: Warm and dry, no rashes   Neuro: Alert and oriented to person, place, time, and situation, CNII-XII grossly intact, no focal deficits  Psych: Normal mood and affect   ASSESSMENT:   Manolo Bosket is a 62 y.o. male who presents for the following: 1. Cerebrovascular accident (CVA), unspecified mechanism ([place])   2. Nonsustained ventricular tachycardia ([place])   3. Primary hypertension   4. Mixed hyperlipidemia   5. RBBB   6. Tobacco abuse     PLAN:   1. Cerebrovascular accident (CVA), unspecified mechanism ([place]) -Lacunar infarct.  Reviewed MRI.  I am not concerned for cardioembolic phenomenon here.  He should continue with aggressive diabetes control as well as lipid-lowering agents.  His blood pressure is well-controlled.  We also discussed smoking cessation which is a big issue here. -No need for TEE.  No need for loop recorder.  2. Nonsustained ventricular tachycardia  ([place]) -Brief insignificant episodes.  No further workup needed.  Echo normal.  No symptoms.  3. Primary hypertension -No change occasions.  4. Mixed hyperlipidemia -On Lipitor 10 mg Monday Wednesdays and Fridays.  LDL 61.  At goal.  5. RBBB -Normal echo.  6. Tobacco abuse -3 minutes Moke cessation counseling was provided in office.      Disposition: Return if symptoms worsen or fail to improve.  Medication Adjustments/Labs and Tests Ordered: Current medicines are reviewed at length with the patient today.  Concerns regarding medicines are outlined above.  Orders Placed This Encounter  Procedures   EKG 12-Lead   No orders of the defined types were placed in this encounter.   Patient Instructions  Medication Instructions:  The current medical regimen is effective;  continue present plan and medications.  *If you need a refill on your cardiac medications before your next appointment, please call your pharmacy*   Follow-Up: At [facility], you and your health needs are our priority.  As part of our continuing mission to provide you with exceptional heart care, we have created designated Provider Care Teams.  These Care Teams include your primary Cardiologist (physician) and Advanced Practice Providers (APPs -  Physician Assistants and Nurse Practitioners) who all work together to provide you with the care you need, when you need it.  We recommend signing up for the patient portal called "MyChart".  Sign up information is provided on this After Visit Summary.  MyChart is used to connect with patients for Virtual Visits (Telemedicine).  Patients are able to view lab/test results, encounter notes, upcoming appointments, etc.  Non-urgent messages can be sent to your provider as well.   To learn more about what you can do with MyChart, go to [email or web address].    Your next appointment:   As needed  Provider:   Eleonore Chiquito, MD     Signed, Addison Naegeli. Audie Box,  MD, [facility]  [address] [place], [place] [address] [phone]  12/24/2022 9:15 AM

## 2022-12-24 ENCOUNTER — Encounter: Payer: Self-pay | Admitting: Cardiovascular Disease

## 2022-12-24 ENCOUNTER — Ambulatory Visit: Payer: BC Managed Care – PPO | Attending: Cardiovascular Disease | Admitting: Cardiovascular Disease

## 2022-12-24 VITALS — BP 130/78 | HR 64 | Ht 72.0 in | Wt 187.4 lb

## 2022-12-24 DIAGNOSIS — I4729 Other ventricular tachycardia: Secondary | ICD-10-CM

## 2022-12-24 DIAGNOSIS — I4510 Unspecified right bundle-branch block: Secondary | ICD-10-CM

## 2022-12-24 DIAGNOSIS — F17210 Nicotine dependence, cigarettes, uncomplicated: Secondary | ICD-10-CM

## 2022-12-24 DIAGNOSIS — I639 Cerebral infarction, unspecified: Secondary | ICD-10-CM

## 2022-12-24 DIAGNOSIS — E782 Mixed hyperlipidemia: Secondary | ICD-10-CM

## 2022-12-24 DIAGNOSIS — I10 Essential (primary) hypertension: Secondary | ICD-10-CM

## 2022-12-24 DIAGNOSIS — Z720 Tobacco use: Secondary | ICD-10-CM

## 2022-12-24 NOTE — Patient Instructions (Signed)
Medication Instructions:  The current medical regimen is effective;  continue present plan and medications.  *If you need a refill on your cardiac medications before your next appointment, please call your pharmacy*   Follow-Up: At [facility], you and your health needs are our priority.  As part of our continuing mission to provide you with exceptional heart care, we have created designated Provider Care Teams.  These Care Teams include your primary Cardiologist (physician) and Advanced Practice Providers (APPs -  Physician Assistants and Nurse Practitioners) who all work together to provide you with the care you need, when you need it.  We recommend signing up for the patient portal called "MyChart".  Sign up information is provided on this After Visit Summary.  MyChart is used to connect with patients for Virtual Visits (Telemedicine).  Patients are able to view lab/test results, encounter notes, upcoming appointments, etc.  Non-urgent messages can be sent to your provider as well.   To learn more about what you can do with MyChart, go to [email or web address].    Your next appointment:   As needed  Provider:   Lennox O'Neal, MD   

## 2023-01-04 DIAGNOSIS — Z720 Tobacco use: Secondary | ICD-10-CM | POA: Diagnosis not present

## 2023-02-18 DIAGNOSIS — E119 Type 2 diabetes mellitus without complications: Secondary | ICD-10-CM | POA: Diagnosis not present

## 2023-02-18 DIAGNOSIS — I10 Essential (primary) hypertension: Secondary | ICD-10-CM | POA: Diagnosis not present

## 2023-03-11 DIAGNOSIS — I10 Essential (primary) hypertension: Secondary | ICD-10-CM | POA: Diagnosis not present

## 2023-03-11 DIAGNOSIS — E1169 Type 2 diabetes mellitus with other specified complication: Secondary | ICD-10-CM | POA: Diagnosis not present

## 2023-03-11 DIAGNOSIS — N521 Erectile dysfunction due to diseases classified elsewhere: Secondary | ICD-10-CM | POA: Diagnosis not present

## 2023-03-11 DIAGNOSIS — E785 Hyperlipidemia, unspecified: Secondary | ICD-10-CM | POA: Diagnosis not present

## 2023-04-17 DIAGNOSIS — N411 Chronic prostatitis: Secondary | ICD-10-CM | POA: Diagnosis not present

## 2023-04-17 DIAGNOSIS — E1169 Type 2 diabetes mellitus with other specified complication: Secondary | ICD-10-CM | POA: Diagnosis not present

## 2023-04-17 DIAGNOSIS — N521 Erectile dysfunction due to diseases classified elsewhere: Secondary | ICD-10-CM | POA: Diagnosis not present

## 2023-04-17 DIAGNOSIS — N400 Enlarged prostate without lower urinary tract symptoms: Secondary | ICD-10-CM | POA: Diagnosis not present

## 2023-08-14 DIAGNOSIS — H3540 Unspecified peripheral retinal degeneration: Secondary | ICD-10-CM | POA: Diagnosis not present

## 2023-08-14 DIAGNOSIS — E119 Type 2 diabetes mellitus without complications: Secondary | ICD-10-CM | POA: Diagnosis not present

## 2023-08-14 LAB — HM DIABETES EYE EXAM

## 2023-09-04 ENCOUNTER — Encounter: Payer: Self-pay | Admitting: Family Medicine

## 2023-09-04 ENCOUNTER — Ambulatory Visit: Payer: BC Managed Care – PPO | Admitting: Family Medicine

## 2023-09-04 VITALS — BP 138/78 | HR 60 | Temp 97.7°F | Ht 72.0 in | Wt 178.5 lb

## 2023-09-04 DIAGNOSIS — F17200 Nicotine dependence, unspecified, uncomplicated: Secondary | ICD-10-CM

## 2023-09-04 DIAGNOSIS — E782 Mixed hyperlipidemia: Secondary | ICD-10-CM

## 2023-09-04 DIAGNOSIS — I639 Cerebral infarction, unspecified: Secondary | ICD-10-CM

## 2023-09-04 DIAGNOSIS — E119 Type 2 diabetes mellitus without complications: Secondary | ICD-10-CM | POA: Diagnosis not present

## 2023-09-04 DIAGNOSIS — Z794 Long term (current) use of insulin: Secondary | ICD-10-CM

## 2023-09-04 DIAGNOSIS — I10 Essential (primary) hypertension: Secondary | ICD-10-CM

## 2023-09-04 DIAGNOSIS — Z23 Encounter for immunization: Secondary | ICD-10-CM

## 2023-09-04 DIAGNOSIS — Z8673 Personal history of transient ischemic attack (TIA), and cerebral infarction without residual deficits: Secondary | ICD-10-CM

## 2023-09-04 LAB — COMPREHENSIVE METABOLIC PANEL
ALT: 16 U/L (ref 0–53)
AST: 18 U/L (ref 0–37)
Albumin: 4.2 g/dL (ref 3.5–5.2)
Alkaline Phosphatase: 58 U/L (ref 39–117)
BUN: 12 mg/dL (ref 6–23)
CO2: 26 meq/L (ref 19–32)
Calcium: 9.2 mg/dL (ref 8.4–10.5)
Chloride: 97 meq/L (ref 96–112)
Creatinine, Ser: 1.01 mg/dL (ref 0.40–1.50)
GFR: 79.84 mL/min (ref >60.00)
Glucose, Bld: 131 mg/dL — ABNORMAL HIGH (ref 70–99)
Potassium: 3.6 meq/L (ref 3.5–5.1)
Sodium: 132 meq/L — ABNORMAL LOW (ref 135–145)
Total Bilirubin: 0.5 mg/dL (ref 0.2–1.2)
Total Protein: 7 g/dL (ref 6.0–8.3)

## 2023-09-04 LAB — LIPID PANEL
Cholesterol: 112 mg/dL (ref 0–200)
HDL: 37.6 mg/dL — ABNORMAL LOW (ref >39.00)
LDL Cholesterol: 58 mg/dL (ref 0–99)
NonHDL: 74.71
Total CHOL/HDL Ratio: 3
Triglycerides: 85 mg/dL (ref 0.0–149.0)
VLDL: 17 mg/dL (ref 0.0–40.0)

## 2023-09-04 LAB — MICROALBUMIN / CREATININE URINE RATIO
Creatinine,U: 29.4 mg/dL
Microalb Creat Ratio: 2.4 mg/g (ref 0.0–30.0)
Microalb, Ur: <0.7 mg/dL (ref 0.0–1.9)

## 2023-09-04 LAB — HEMOGLOBIN A1C: Hgb A1c MFr Bld: 7 % — ABNORMAL HIGH (ref 4.6–6.5)

## 2023-09-04 MED ORDER — CLOPIDOGREL BISULFATE 75 MG PO TABS: 75.0000 mg | ORAL_TABLET | Freq: Every day | ORAL | 1 refills | Status: DC

## 2023-09-04 MED ORDER — FREESTYLE LIBRE 3 SENSOR MISC: 1.0000 | Freq: Every day | 11 refills | Status: DC

## 2023-09-04 MED ORDER — METFORMIN HCL 1000 MG PO TABS: 1000.0000 mg | ORAL_TABLET | Freq: Two times a day (BID) | ORAL | 3 refills | Status: DC

## 2023-09-04 MED ORDER — ATORVASTATIN CALCIUM 10 MG PO TABS: 10.0000 mg | ORAL_TABLET | ORAL | 1 refills | Status: DC

## 2023-09-04 MED ORDER — FREESTYLE LIBRE 3 READER DEVI: 1.0000 | Freq: Every day | 3 refills | Status: DC

## 2023-09-04 MED ORDER — SEMAGLUTIDE(0.25 OR 0.5MG/DOS) 2 MG/3ML ~~LOC~~ SOPN: PEN_INJECTOR | SUBCUTANEOUS | 5 refills | Status: DC

## 2023-09-04 MED ORDER — LOSARTAN POTASSIUM-HCTZ 100-25 MG PO TABS: 1.0000 | ORAL_TABLET | Freq: Every day | ORAL | 1 refills | Status: DC

## 2023-09-04 MED ORDER — NICOTINE 14 MG/24HR TD PT24: 14.0000 mg | MEDICATED_PATCH | Freq: Every day | TRANSDERMAL | 0 refills | Status: AC

## 2023-09-04 MED ORDER — AMLODIPINE BESYLATE 10 MG PO TABS: 10.0000 mg | ORAL_TABLET | Freq: Every morning | ORAL | 1 refills | Status: DC

## 2023-09-04 MED ORDER — NICOTINE 7 MG/24HR TD PT24: 7.0000 mg | MEDICATED_PATCH | Freq: Every day | TRANSDERMAL | 0 refills | Status: AC

## 2023-09-04 MED ORDER — NICOTINE 21 MG/24HR TD PT24: 21.0000 mg | MEDICATED_PATCH | Freq: Every day | TRANSDERMAL | 0 refills | Status: AC

## 2023-09-04 NOTE — Assessment & Plan Note (Signed)
Current hypertension medications:       Sig   losartan-hydrochlorothiazide (HYZAAR) 100-25 MG tablet (Taking) Take 1 tablet by mouth daily.   tadalafil (CIALIS) 5 MG tablet (Taking) Take 5 mg by mouth in the morning.   amLODipine (NORVASC) 10 MG tablet Take 1 tablet (10 mg total) by mouth in the morning.      BP is will controlled on the above meds, he is asking for the combo pill instead of the separate losartan/HCT, refills sent to pharmacy. Advised that he check his blood pressure at least 1-2 times per week at home.

## 2023-09-04 NOTE — Assessment & Plan Note (Signed)
Seems to be well controlled, however given his smoking history and CVA history I recommended stopping the [place] and switching him to Ozempic to help reduce his overall vascular risk. Pt is agreeable to the changes. I am checking his labs today. RTC in 3 months for follow up A1C.

## 2023-09-04 NOTE — Assessment & Plan Note (Signed)
Reviewed last labs, he needs new labs for surveillance today, refilled his atorvastatin.

## 2023-09-04 NOTE — Assessment & Plan Note (Signed)
I spent 5 minutes counseling patient on smoking cessation, he is agreeable to starting the patches to aid in quitting. Is is eligible for LDCT for lung cancer screening which I will discuss with him at the next visit. Lungs clear on exam today.

## 2023-09-04 NOTE — Progress Notes (Signed)
New Patient Office Visit  Subjective    Patient ID: Franke Menter, male    DOB: 11-20-1961  Age: 62 y.o. MRN: [id number]  CC:  Chief Complaint  Patient presents with   Establish Care    HPI Seichi Kaufhold presents to establish care Pt did have a previous PCP, patient has a history of HTN, states he had a small stroke in the thalamus in November 2023. He has been followed by the neurologist and the cardiologist. I have reviewed his latest progress notes and the discharge summary.   DM-- patient is currently on Janumet 50/1000 mg BID and tresiba once daily. Pt reports he doesn't take the tresiba as often as prescribed. His last A1C in November 2023 was 6.9. pt states he does watch his diet, has been to a dietician in the past. He denies any vision issues, no polyuria, no weight loss or gain. We had a long discussion about switching him to Ozempic once weekly due to his history of CVA, risks/ benefits were discussed with patient and he is agreeable to try the medication.   HTN -- BP in office performed and is well controlled. He  reports no side effects to the medications, no chest pain, SOB, dizziness or headaches. He has a BP cuff at home but does not check BP regularly, reports they are in the normal range.   HLD-- is on atorvastatin 10 mg daily, he denies any side effects to the medication at  this time.   CVA-- was placed on plavix after his CVA, was previously taking aspirin. Needs ot stay on this medication indefinitely for secondary prevention  Tobacco use disorder-- pt smokes about 1 ppd for 40 years, has had CVA and has HTN. Counseled patient on smoking cessation for 5 minutes, he is considering quitting for his health. He had tried chantix in the past but had abnormal dreams with this medication. We discussed nicotine replacement and he is willing to try this.   I have reviewed all aspects of the patient's medical history including social, family, and surgical  history.   Current Outpatient Medications  Medication Instructions   amLODipine (NORVASC) 10 mg, Oral, Every morning   atorvastatin (LIPITOR) 10 mg, Oral, Every M-W-F   clopidogrel (PLAVIX) 75 mg, Oral, Daily   Continuous Glucose Receiver (FREESTYLE LIBRE 3 READER) DEVI 1 Act, Does not apply, Daily   Continuous Glucose Sensor (FREESTYLE LIBRE 3 SENSOR) MISC 1 Act, Does not apply, Daily, Place 1 sensor on the skin every 14 days. Use to check glucose continuously   losartan-hydrochlorothiazide (HYZAAR) 100-25 MG tablet 1 tablet, Oral, Daily   metFORMIN (GLUCOPHAGE) 1,000 mg, Oral, 2 times daily with meals   nicotine (NICOTINE STEP 1) 21 mg, Transdermal, Daily   [START ON 10/04/2023] nicotine (NICOTINE STEP 2) 14 mg, Transdermal, Daily   [START ON 11/01/2023] nicotine (NICOTINE STEP 3) 7 mg, Transdermal, Daily   Semaglutide,0.25 or 0.5MG /DOS, 2 MG/3ML SOPN Inject 0.25 mg into the skin once a week for 28 days, THEN 0.5 mg once a week.   tadalafil (CIALIS) 5 mg, Oral, Every morning   Tresiba FlexTouch 10 Units, Subcutaneous, Daily    Past Medical History:  Diagnosis Date   Allergies    Arthritis    Colon polyps    benign per patient   Diabetes mellitus without complication (HCC)    GERD (gastroesophageal reflux disease)    History of chicken pox    Hypertension    Stroke (cerebrum) (HCC)  Past Surgical History:  Procedure Laterality Date   APPENDECTOMY      Family History  Problem Relation Age of Onset   Emphysema Mother    COPD Mother    Alzheimer's disease Father    Heart disease Father    Diabetes Father    Arrhythmia Brother     Social History   Socioeconomic History   Marital status: Divorced    Spouse name: Not on file   Number of children: 2   Years of education: Not on file   Highest education level: 12th grade  Occupational History   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[place]  Tobacco Use   Smoking status: Every Day    Current packs/day: 0.50     Average packs/day: 0.5 packs/day for 40.0 years (20.0 ttl pk-yrs)    Types: Cigarettes   Smokeless tobacco: Current    Types: Chew  Vaping Use   Vaping status: Never Used  Substance and Sexual Activity   Alcohol use: Yes    Alcohol/week: 3.0 standard drinks of alcohol    Types: 3 Cans of beer per week   Drug use: Never   Sexual activity: Yes  Other Topics Concern   Not on file  Social History Narrative   Not on file   Social Determinants of Health   Financial Resource Strain: Low Risk  (08/31/2023)   Overall Financial Resource Strain (CARDIA)    Difficulty of Paying Living Expenses: Not hard at all  Food Insecurity: No Food Insecurity (08/31/2023)   Hunger Vital Sign    Worried About Running Out of Food in the Last Year: Never true    Ran Out of Food in the Last Year: Never true  Transportation Needs: No Transportation Needs (08/31/2023)   PRAPARE - Administrator, Civil Service (Medical): No    Lack of Transportation (Non-Medical): No  Physical Activity: Insufficiently Active (08/31/2023)   Exercise Vital Sign    Days of Exercise per Week: 3 days    Minutes of Exercise per Session: 20 min  Stress: No Stress Concern Present (08/31/2023)   [employer] of Occupational Health - Occupational Stress Questionnaire    Feeling of Stress : Not at all  Social Connections: Unknown (08/31/2023)   Social Connection and Isolation Panel [NHANES]    Frequency of Communication with Friends and Family: Patient declined    Frequency of Social Gatherings with Friends and Family: Patient declined    Attends Religious Services: Patient declined    Database administrator or Organizations: No    Attends Engineer, structural: Not on file    Marital Status: Living with partner  Intimate Partner Violence: Not At Risk (10/28/2022)   Humiliation, Afraid, Rape, and Kick questionnaire    Fear of Current or Ex-Partner: No    Emotionally Abused: No    Physically Abused: No     Sexually Abused: No    Review of Systems  All other systems reviewed and are negative.       Objective    BP 138/78 (BP Location: Left Arm, Patient Position: Sitting, Cuff Size: Normal)   Pulse 60   Temp 97.7 F (36.5 C) (Oral)   Ht 6' (1.829 m)   Wt 178 lb 8 oz (81 kg)   SpO2 99%   BMI 24.21 kg/m   Physical Exam Vitals reviewed.  Constitutional:      Appearance: Normal appearance. He is well-groomed and normal weight.  Eyes:     Extraocular Movements:  Extraocular movements intact.     Conjunctiva/sclera: Conjunctivae normal.  Neck:     Thyroid: No thyromegaly.  Cardiovascular:     Rate and Rhythm: Normal rate and regular rhythm.     Heart sounds: S1 normal and S2 normal. No murmur heard. Pulmonary:     Effort: Pulmonary effort is normal.     Breath sounds: Normal breath sounds and air entry. No rales.  Abdominal:     General: Abdomen is flat. Bowel sounds are normal.  Musculoskeletal:     Right lower leg: No edema.     Left lower leg: No edema.  Neurological:     General: No focal deficit present.     Mental Status: He is alert and oriented to person, place, and time.     Gait: Gait is intact.  Psychiatric:        Mood and Affect: Mood and affect normal.         Assessment & Plan:  Need for immunization against influenza -     Flu vaccine trivalent PF, 6mos and older(Flulaval,Afluria,Fluarix,Fluzone)  Benign essential HTN Assessment & Plan: Current hypertension medications:       Sig   losartan-hydrochlorothiazide (HYZAAR) 100-25 MG tablet (Taking) Take 1 tablet by mouth daily.   tadalafil (CIALIS) 5 MG tablet (Taking) Take 5 mg by mouth in the morning.   amLODipine (NORVASC) 10 MG tablet Take 1 tablet (10 mg total) by mouth in the morning.      BP is will controlled on the above meds, he is asking for the combo pill instead of the separate losartan/HCT, refills sent to pharmacy. Advised that he check his blood pressure at least 1-2 times per week  at home.   Orders: -     Comprehensive metabolic panel -     amLODIPine Besylate; Take 1 tablet (10 mg total) by mouth in the morning.  Dispense: 90 tablet; Refill: 1 -     Losartan Potassium-HCTZ; Take 1 tablet by mouth daily.  Dispense: 90 tablet; Refill: 1  Mixed hyperlipidemia Assessment & Plan: Reviewed last labs, he needs new labs for surveillance today, refilled his atorvastatin.   Orders: -     Lipid panel -     Atorvastatin Calcium; Take 1 tablet (10 mg total) by mouth every Monday, Wednesday, and Friday.  Dispense: 90 tablet; Refill: 1  Type 2 diabetes mellitus without complication, with long-term current use of insulin (HCC) Assessment & Plan: Seems to be well controlled, however given his smoking history and CVA history I recommended stopping the [place] and switching him to Ozempic to help reduce his overall vascular risk. Pt is agreeable to the changes. I am checking his labs today. RTC in 3 months for follow up A1C.   Orders: -     Hemoglobin A1c -     FreeStyle Libre 3 Sensor; 1 Act by Does not apply route daily. Place 1 sensor on the skin every 14 days. Use to check glucose continuously  Dispense: 2 each; Refill: 11 -     FreeStyle Libre 3 Reader; 1 Act by Does not apply route daily.  Dispense: 1 each; Refill: 3 -     metFORMIN HCl; Take 1 tablet (1,000 mg total) by mouth 2 (two) times daily with a meal.  Dispense: 180 tablet; Refill: 3 -     Semaglutide(0.25 or 0.5MG /DOS); Inject 0.25 mg into the skin once a week for 28 days, THEN 0.5 mg once a week.  Dispense: 3 mL; Refill:  5 -     Microalbumin / creatinine urine ratio  Cerebrovascular accident (CVA), unspecified mechanism [facility]) Assessment & Plan: H/o in November 2023. Pt reports no residual symptoms at this time. Will continue clopidogrel indefinitely.   Orders: -     Clopidogrel Bisulfate; Take 1 tablet (75 mg total) by mouth daily.  Dispense: 90 tablet; Refill: 1  Tobacco dependence Assessment & Plan: I spent  5 minutes counseling patient on smoking cessation, he is agreeable to starting the patches to aid in quitting. Is is eligible for LDCT for lung cancer screening which I will discuss with him at the next visit. Lungs clear on exam today.   Orders: -     Nicotine; Place 1 patch (21 mg total) onto the skin daily.  Dispense: 28 patch; Refill: 0 -     Nicotine; Place 1 patch (14 mg total) onto the skin daily.  Dispense: 28 patch; Refill: 0 -     Nicotine; Place 1 patch (7 mg total) onto the skin daily.  Dispense: 28 patch; Refill: 0    Return in about 3 months (around 12/05/2023) for DM.   Karie Georges, MD

## 2023-09-04 NOTE — Assessment & Plan Note (Signed)
H/o in November 2023. Pt reports no residual symptoms at this time. Will continue clopidogrel indefinitely.

## 2023-10-12 DIAGNOSIS — J209 Acute bronchitis, unspecified: Secondary | ICD-10-CM | POA: Diagnosis not present

## 2023-10-12 DIAGNOSIS — R030 Elevated blood-pressure reading, without diagnosis of hypertension: Secondary | ICD-10-CM | POA: Diagnosis not present

## 2023-12-05 ENCOUNTER — Encounter: Payer: Self-pay | Admitting: Family Medicine

## 2023-12-05 ENCOUNTER — Ambulatory Visit (INDEPENDENT_AMBULATORY_CARE_PROVIDER_SITE_OTHER): Payer: BC Managed Care – PPO | Admitting: Family Medicine

## 2023-12-05 VITALS — BP 138/70 | HR 59 | Temp 97.9°F | Ht 72.0 in | Wt 185.0 lb

## 2023-12-05 DIAGNOSIS — Z794 Long term (current) use of insulin: Secondary | ICD-10-CM

## 2023-12-05 DIAGNOSIS — E119 Type 2 diabetes mellitus without complications: Secondary | ICD-10-CM | POA: Diagnosis not present

## 2023-12-05 DIAGNOSIS — F17210 Nicotine dependence, cigarettes, uncomplicated: Secondary | ICD-10-CM | POA: Diagnosis not present

## 2023-12-05 LAB — POCT GLYCOSYLATED HEMOGLOBIN (HGB A1C): Hemoglobin A1C: 7.4 % — AB (ref 4.0–5.6)

## 2023-12-05 MED ORDER — SEMAGLUTIDE (1 MG/DOSE) 4 MG/3ML ~~LOC~~ SOPN: 1.0000 mg | PEN_INJECTOR | SUBCUTANEOUS | 2 refills | Status: DC

## 2023-12-05 NOTE — Assessment & Plan Note (Signed)
 Pt is doing well on the current medications-- metformin 1000 mg BID and ozempic 0.5 mg weekly, A1C is 7.4 today, will increase Ozempic to 1 mg weekly. RTC in 3 months for repeat A1C

## 2023-12-05 NOTE — Progress Notes (Signed)
 Established Patient Office Visit  Subjective   Patient ID: Aaron Martinez, male    DOB: 07-25-1961  Age: 63 y.o. MRN: [id number]  Chief Complaint  Patient presents with   Medical Management of Chronic Issues    Patient is here for 3 month follow up. States that he tried to cut down on his own without the patches, is down to about 15 cigarettes per day.  States that he switched jobs in October and has less time to smoke.   DM-- pt is off the tresiba completely, is taking the 0.5 mg weekly ozempic  and metformin . A1C today is 7.4, he denies any side effects from the medications. Foot exam performed today.  Health maintenance reviewed today, he is due for his LDCT.     Current Outpatient Medications  Medication Instructions   amLODipine  (NORVASC ) 10 mg, Oral, Every morning   atorvastatin  (LIPITOR) 10 mg, Oral, Every M-W-F   clopidogrel  (PLAVIX ) 75 mg, Oral, Daily   Continuous Glucose Receiver (FREESTYLE LIBRE 3 READER) DEVI 1 Act, Does not apply, Daily   Continuous Glucose Sensor (FREESTYLE LIBRE 3 SENSOR) MISC 1 Act, Does not apply, Daily, Place 1 sensor on the skin every 14 days. Use to check glucose continuously   losartan -hydrochlorothiazide (HYZAAR) 100-25 MG tablet 1 tablet, Oral, Daily   metFORMIN  (GLUCOPHAGE ) 1,000 mg, Oral, 2 times daily with meals   nicotine  (NICOTINE  STEP 1) 21 mg, Transdermal, Daily   nicotine  (NICOTINE  STEP 2) 14 mg, Transdermal, Daily   nicotine  (NICOTINE  STEP 3) 7 mg, Transdermal, Daily   Semaglutide  (1 MG/DOSE) 1 mg, Injection, Weekly   tadalafil (CIALIS) 5 mg, Every morning    Patient Active Problem List   Diagnosis Date Noted   CVA (cerebral vascular accident) (HCC) 10/28/2022   Hypokalemia 10/28/2022   HLD (hyperlipidemia) 10/28/2022   DMII (diabetes mellitus, type 2) (HCC) 10/28/2022   Tobacco dependence 04/22/2020   Benign essential HTN 01/02/2019      Review of Systems  All other systems reviewed and are negative.      Objective:     BP 138/70   Pulse (!) 59   Temp 97.9 F (36.6 C) (Oral)   Ht 6' (1.829 m)   Wt 185 lb (83.9 kg)   SpO2 98%   BMI 25.09 kg/m    Physical Exam Vitals reviewed.  Constitutional:      Appearance: Normal appearance. He is well-groomed and normal weight.  Cardiovascular:     Rate and Rhythm: Normal rate and regular rhythm.     Heart sounds: S1 normal and S2 normal. No murmur heard. Pulmonary:     Effort: Pulmonary effort is normal.     Breath sounds: Normal breath sounds and air entry. No rales.  Musculoskeletal:     Right lower leg: No edema.     Left lower leg: No edema.  Neurological:     General: No focal deficit present.     Mental Status: He is alert and oriented to person, place, and time.     Gait: Gait is intact.  Psychiatric:        Mood and Affect: Mood and affect normal.     Diabetic Foot Exam - Simple   Simple Foot Form Diabetic Foot exam was performed with the following findings: Yes 12/05/2023  9:00 AM  Visual Inspection No deformities, no ulcerations, no other skin breakdown bilaterally: Yes Sensation Testing Intact to touch and monofilament testing bilaterally: Yes Pulse Check Posterior Tibialis and Dorsalis pulse intact  bilaterally: Yes Comments     Results for orders placed or performed in visit on 12/05/23  POC HgB A1c  Result Value Ref Range   Hemoglobin A1C 7.4 (A) 4.0 - 5.6 %   HbA1c POC (<> result, manual entry)     HbA1c, POC (prediabetic range)     HbA1c, POC (controlled diabetic range)        The ASCVD Risk score (Arnett DK, et al., 2019) failed to calculate for the following reasons:   Risk score cannot be calculated because patient has a medical history suggesting prior/existing ASCVD    Assessment & Plan:  Type 2 diabetes mellitus without complication, with long-term current use of insulin  (HCC) Assessment & Plan: Pt is doing well on the current medications-- metformin  1000 mg BID and ozempic  0.5 mg weekly, A1C  is 7.4 today, will increase Ozempic  to 1 mg weekly. RTC in 3 months for repeat A1C  Orders: -     POCT glycosylated hemoglobin (Hb A1C) -     Semaglutide  (1 MG/DOSE); Inject 1 mg as directed once a week.  Dispense: 3 mL; Refill: 2  Cigarette smoker -     CT CHEST LUNG CANCER SCREENING LOW DOSE WO CONTRAST; Future     Return in about 3 months (around 03/04/2024) for DM.    Heron CHRISTELLA Sharper, MD

## 2024-01-07 ENCOUNTER — Encounter: Payer: Self-pay | Admitting: Nurse Practitioner

## 2024-01-07 ENCOUNTER — Ambulatory Visit: Payer: BC Managed Care – PPO | Admitting: Nurse Practitioner

## 2024-01-07 VITALS — BP 124/64 | HR 77 | Temp 98.2°F

## 2024-01-07 DIAGNOSIS — J101 Influenza due to other identified influenza virus with other respiratory manifestations: Secondary | ICD-10-CM

## 2024-01-07 LAB — POCT INFLUENZA A/B
Influenza A, POC: POSITIVE — AB
Influenza B, POC: NEGATIVE

## 2024-01-07 LAB — POC COVID19 BINAXNOW: SARS Coronavirus 2 Ag: NEGATIVE

## 2024-01-07 MED ORDER — OSELTAMIVIR PHOSPHATE 75 MG PO CAPS: 75.0000 mg | ORAL_CAPSULE | Freq: Two times a day (BID) | ORAL | 0 refills | Status: DC

## 2024-01-07 MED ORDER — ALBUTEROL SULFATE HFA 108 (90 BASE) MCG/ACT IN AERS: 1.0000 | INHALATION_SPRAY | Freq: Four times a day (QID) | RESPIRATORY_TRACT | 0 refills | Status: DC | PRN

## 2024-01-07 MED ORDER — HYDROCODONE BIT-HOMATROP MBR 5-1.5 MG/5ML PO SOLN: 5.0000 mL | Freq: Three times a day (TID) | ORAL | 0 refills | Status: DC | PRN

## 2024-01-07 NOTE — Progress Notes (Signed)
 Acute Office Visit  Subjective:    Patient ID: Aaron Martinez, male    DOB: December 04, 1960, 63 y.o.   MRN: [id number]  Chief Complaint  Patient presents with   Cough    With congestion, sneezing, chills, headache, no energy, since Sunday night   URI  This is a new problem. The current episode started in the past 7 days. The problem has been unchanged. Associated symptoms include congestion, coughing, headaches, joint pain, rhinorrhea, sinus pain and sneezing. Pertinent negatives include no abdominal pain, chest pain, diarrhea, dysuria, ear pain, joint swelling, nausea, neck pain, plugged ear sensation, rash, sore throat, swollen glands, vomiting or wheezing. He has tried NSAIDs for the symptoms. The treatment provided mild relief.  Sick contact with daughter on Sunday. Current tobacco use  Outpatient Medications Prior to Visit  Medication Sig   amLODipine  (NORVASC ) 10 MG tablet Take 1 tablet (10 mg total) by mouth in the morning.   atorvastatin  (LIPITOR) 10 MG tablet Take 1 tablet (10 mg total) by mouth every Monday, Wednesday, and Friday.   clopidogrel  (PLAVIX ) 75 MG tablet Take 1 tablet (75 mg total) by mouth daily.   losartan -hydrochlorothiazide (HYZAAR) 100-25 MG tablet Take 1 tablet by mouth daily.   metFORMIN  (GLUCOPHAGE ) 1000 MG tablet Take 1 tablet (1,000 mg total) by mouth 2 (two) times daily with a meal.   nicotine  (NICOTINE  STEP 1) 21 mg/24hr patch Place 1 patch (21 mg total) onto the skin daily.   nicotine  (NICOTINE  STEP 2) 14 mg/24hr patch Place 1 patch (14 mg total) onto the skin daily.   nicotine  (NICOTINE  STEP 3) 7 mg/24hr patch Place 1 patch (7 mg total) onto the skin daily.   Semaglutide , 1 MG/DOSE, 4 MG/3ML SOPN Inject 1 mg as directed once a week.   tadalafil (CIALIS) 5 MG tablet Take 5 mg by mouth in the morning.   Continuous Glucose Receiver (FREESTYLE LIBRE 3 READER) DEVI 1 Act by Does not apply route daily. (Patient not taking: Reported on 01/07/2024)    Continuous Glucose Sensor (FREESTYLE LIBRE 3 SENSOR) MISC 1 Act by Does not apply route daily. Place 1 sensor on the skin every 14 days. Use to check glucose continuously (Patient not taking: Reported on 01/07/2024)   No facility-administered medications prior to visit.    Reviewed past medical and social history.  Review of Systems  HENT:  Positive for congestion, rhinorrhea, sinus pain and sneezing. Negative for ear pain and sore throat.   Respiratory:  Positive for cough. Negative for wheezing.   Cardiovascular:  Negative for chest pain.  Gastrointestinal:  Negative for abdominal pain, diarrhea, nausea and vomiting.  Genitourinary:  Negative for dysuria.  Musculoskeletal:  Positive for joint pain. Negative for neck pain.  Skin:  Negative for rash.  Neurological:  Positive for headaches.   Per HPI     Objective:    Physical Exam Vitals and nursing note reviewed.  Constitutional:      General: He is not in acute distress. HENT:     Right Ear: Tympanic membrane, ear canal and external ear normal.     Left Ear: Tympanic membrane, ear canal and external ear normal.     Nose: Congestion and rhinorrhea present. No nasal tenderness or mucosal edema.     Right Nostril: No occlusion.     Left Nostril: No occlusion.     Right Turbinates: Not enlarged, swollen or pale.     Left Turbinates: Not enlarged, swollen or pale.  Right Sinus: No maxillary sinus tenderness or frontal sinus tenderness.     Left Sinus: No maxillary sinus tenderness or frontal sinus tenderness.     Mouth/Throat:     Pharynx: Oropharynx is clear. Uvula midline.     Tonsils: No tonsillar exudate or tonsillar abscesses.  Eyes:     Extraocular Movements: Extraocular movements intact.     Conjunctiva/sclera: Conjunctivae normal.  Cardiovascular:     Rate and Rhythm: Normal rate and regular rhythm.     Pulses: Normal pulses.     Heart sounds: Normal heart sounds.  Pulmonary:     Effort: Pulmonary effort is normal.      Breath sounds: Normal breath sounds.  Musculoskeletal:     Cervical back: Normal range of motion and neck supple.  Lymphadenopathy:     Cervical: No cervical adenopathy.  Neurological:     Mental Status: He is alert and oriented to person, place, and time.     BP 124/64 (BP Location: Right Arm, Patient Position: Sitting, Cuff Size: Normal)   Pulse 77   Temp 98.2 F (36.8 C) (Oral)   SpO2 98%    Results for orders placed or performed in visit on 01/07/24  POC COVID-19  Result Value Ref Range   SARS Coronavirus 2 Ag Negative Negative  POCT Influenza A/B  Result Value Ref Range   Influenza A, POC Positive (A) Negative   Influenza B, POC Negative Negative      Assessment & Plan:   Problem List Items Addressed This Visit   None Visit Diagnoses       Influenza A    -  Primary   Relevant Medications   oseltamivir  (TAMIFLU ) 75 MG capsule   HYDROcodone  bit-homatropine (HYCODAN) 5-1.5 MG/5ML syrup   albuterol  (VENTOLIN  HFA) 108 (90 Base) MCG/ACT inhaler   Other Relevant Orders   POC COVID-19 (Completed)   POCT Influenza A/B (Completed)      Meds ordered this encounter  Medications   oseltamivir  (TAMIFLU ) 75 MG capsule    Sig: Take 1 capsule (75 mg total) by mouth 2 (two) times daily.    Dispense:  10 capsule    Refill:  0    Supervising Provider:   BERNETA FALLOW ALFRED [[id number]]   HYDROcodone  bit-homatropine (HYCODAN) 5-1.5 MG/5ML syrup    Sig: Take 5 mLs by mouth every 8 (eight) hours as needed for cough.    Dispense:  120 mL    Refill:  0    Supervising Provider:   BERNETA FALLOW ALFRED [[id number]]   albuterol  (VENTOLIN  HFA) 108 (90 Base) MCG/ACT inhaler    Sig: Inhale 1-2 puffs into the lungs every 6 (six) hours as needed.    Dispense:  8 g    Refill:  0    Supervising Provider:   BERNETA FALLOW SAYRE [[id number]]   Return if symptoms worsen or fail to improve.    Roselie Mood, NP

## 2024-01-07 NOTE — Patient Instructions (Signed)

## 2024-01-20 ENCOUNTER — Telehealth: Payer: Self-pay | Admitting: Family Medicine

## 2024-01-20 NOTE — Telephone Encounter (Signed)
Copied from CRM [id number]. Topic: Clinical - Request for Lab/Test Order >> Jan 20, 2024 10:28 AM Orinda Kenner C wrote: Reason for CRM: Levert Feinstein from [facility] [phone] was referred for CT lung scan and his insurance needs a precertification. Please advise.

## 2024-01-22 ENCOUNTER — Ambulatory Visit (HOSPITAL_BASED_OUTPATIENT_CLINIC_OR_DEPARTMENT_OTHER)
Admission: RE | Admit: 2024-01-22 | Discharge: 2024-01-22 | Disposition: A | Discharge status: Home / Self Care | Payer: BC Managed Care – PPO | Source: Ambulatory Visit | Attending: Family Medicine | Admitting: Family Medicine

## 2024-01-22 DIAGNOSIS — F17210 Nicotine dependence, cigarettes, uncomplicated: Secondary | ICD-10-CM | POA: Diagnosis not present

## 2024-02-12 ENCOUNTER — Encounter: Payer: Self-pay | Admitting: Family Medicine

## 2024-02-27 ENCOUNTER — Other Ambulatory Visit: Payer: Self-pay | Admitting: Family Medicine

## 2024-02-27 DIAGNOSIS — I10 Essential (primary) hypertension: Secondary | ICD-10-CM

## 2024-03-04 ENCOUNTER — Ambulatory Visit: Payer: Self-pay | Admitting: Family Medicine

## 2024-03-04 VITALS — BP 130/70 | HR 65 | Temp 98.1°F | Ht 72.0 in | Wt 179.3 lb

## 2024-03-04 DIAGNOSIS — E119 Type 2 diabetes mellitus without complications: Secondary | ICD-10-CM

## 2024-03-04 DIAGNOSIS — I10 Essential (primary) hypertension: Secondary | ICD-10-CM

## 2024-03-04 DIAGNOSIS — I639 Cerebral infarction, unspecified: Secondary | ICD-10-CM

## 2024-03-04 DIAGNOSIS — Z794 Long term (current) use of insulin: Secondary | ICD-10-CM

## 2024-03-04 LAB — POCT GLYCOSYLATED HEMOGLOBIN (HGB A1C): Hemoglobin A1C: 7.6 % — AB (ref 4.0–5.6)

## 2024-03-04 MED ORDER — SEMAGLUTIDE (2 MG/DOSE) 8 MG/3ML ~~LOC~~ SOPN: 2.0000 mg | PEN_INJECTOR | SUBCUTANEOUS | 5 refills | Status: DC

## 2024-03-04 MED ORDER — AMLODIPINE BESYLATE 10 MG PO TABS: 10.0000 mg | ORAL_TABLET | Freq: Every morning | ORAL | 1 refills | Status: DC

## 2024-03-04 MED ORDER — CLOPIDOGREL BISULFATE 75 MG PO TABS: 75.0000 mg | ORAL_TABLET | Freq: Every day | ORAL | 1 refills | Status: DC

## 2024-03-04 NOTE — Assessment & Plan Note (Signed)
 A1C is stable off insulin and sitagliptin. Will increase ozempic to 2 mg weekly, recheck A1C In 3 months. Continue metformin 1000 mg BID also.

## 2024-03-04 NOTE — Assessment & Plan Note (Signed)
 H/o in November 2023. Pt reports no residual symptoms at this time. Will continue clopidogrel indefinitely.  Refills sent to pharmacy.

## 2024-03-04 NOTE — Progress Notes (Signed)
 Established Patient Office Visit  Subjective   Patient ID: Aaron Martinez, male    DOB: 12/08/1960  Age: 63 y.o. MRN: [id number]  Chief Complaint  Patient presents with   Medical Management of Chronic Issues    Pt is here for follow up diabetes  and HTN. He reports he is tolerating the ozempic 1 mg weekly. He reports a mild decrease in appetite, states he gets full faster than normal. No nausea, no constipation.   Smoking-- pt reports he is down to about 5 cigarettes per day. We reviewed the results of his LDCT-- showed smoking related inflammation and coronary calcifications. Pt denies any chest pain, no cough or SOB  HTN -- BP in office performed and is well controlled. He  reports no side effects to the medications, no chest pain, SOB, dizziness or headaches. He has a BP cuff at home and is checking BP regularly, reports they are in the normal range.        Current Outpatient Medications  Medication Instructions   albuterol (VENTOLIN HFA) 108 (90 Base) MCG/ACT inhaler 1-2 puffs, Inhalation, Every 6 hours PRN   amLODipine (NORVASC) 10 mg, Oral, Every morning   atorvastatin (LIPITOR) 10 mg, Oral, Every M-W-F   clopidogrel (PLAVIX) 75 mg, Oral, Daily   Continuous Glucose Receiver (FREESTYLE LIBRE 3 READER) DEVI 1 Act, Does not apply, Daily   Continuous Glucose Sensor (FREESTYLE LIBRE 3 SENSOR) MISC 1 Act, Does not apply, Daily, Place 1 sensor on the skin every 14 days. Use to check glucose continuously   losartan-hydrochlorothiazide (HYZAAR) 100-25 MG tablet 1 tablet, Oral, Daily   metFORMIN (GLUCOPHAGE) 1,000 mg, Oral, 2 times daily with meals   nicotine (NICOTINE STEP 1) 21 mg, Transdermal, Daily   nicotine (NICOTINE STEP 2) 14 mg, Transdermal, Daily   nicotine (NICOTINE STEP 3) 7 mg, Transdermal, Daily   Semaglutide (2 MG/DOSE) 2 mg, Injection, Weekly   tadalafil (CIALIS) 5 mg, Every morning    Patient Active Problem List   Diagnosis Date Noted   CVA (cerebral  vascular accident) (HCC) 10/28/2022   Hypokalemia 10/28/2022   HLD (hyperlipidemia) 10/28/2022   DMII (diabetes mellitus, type 2) (HCC) 10/28/2022   Tobacco dependence 04/22/2020   Benign essential HTN 01/02/2019      Review of Systems  All other systems reviewed and are negative.     Objective:     BP 130/70   Pulse 65   Temp 98.1 F (36.7 C) (Oral)   Ht 6' (1.829 m)   Wt 179 lb 4.8 oz (81.3 kg)   SpO2 98%   BMI 24.32 kg/m    Physical Exam Vitals reviewed.  Constitutional:      Appearance: Normal appearance. He is normal weight.  Cardiovascular:     Rate and Rhythm: Normal rate and regular rhythm.     Heart sounds: Normal heart sounds. No murmur heard. Pulmonary:     Effort: Pulmonary effort is normal.     Breath sounds: Normal breath sounds. No wheezing or rales.  Musculoskeletal:     Right lower leg: No edema.     Left lower leg: No edema.  Neurological:     Mental Status: He is alert and oriented to person, place, and time.      Results for orders placed or performed in visit on 03/04/24  POC HgB A1c  Result Value Ref Range   Hemoglobin A1C 7.6 (A) 4.0 - 5.6 %   HbA1c POC (<> result, manual entry)  HbA1c, POC (prediabetic range)     HbA1c, POC (controlled diabetic range)        The ASCVD Risk score (Arnett DK, et al., 2019) failed to calculate for the following reasons:   Risk score cannot be calculated because patient has a medical history suggesting prior/existing ASCVD    Assessment & Plan:  Type 2 diabetes mellitus without complication, with long-term current use of insulin (HCC) Assessment & Plan: A1C is stable off insulin and sitagliptin. Will increase ozempic to 2 mg weekly, recheck A1C In 3 months. Continue metformin 1000 mg BID also.   Orders: -     POCT glycosylated hemoglobin (Hb A1C) -     Collection capillary blood specimen -     Semaglutide (2 MG/DOSE); Inject 2 mg as directed once a week.  Dispense: 3 mL; Refill: 5 -      Hemoglobin A1c; Future  Cerebrovascular accident (CVA), unspecified mechanism [facility]) Assessment & Plan: H/o in November 2023. Pt reports no residual symptoms at this time. Will continue clopidogrel indefinitely.  Refills sent to pharmacy.  Orders: -     Clopidogrel Bisulfate; Take 1 tablet (75 mg total) by mouth daily.  Dispense: 90 tablet; Refill: 1  Benign essential HTN Assessment & Plan: Current hypertension medications:       Sig   losartan-hydrochlorothiazide (HYZAAR) 100-25 MG tablet (Taking) TAKE 1 TABLET BY MOUTH DAILY   tadalafil (CIALIS) 5 MG tablet (Taking) Take 5 mg by mouth in the morning.   amLODipine (NORVASC) 10 MG tablet Take 1 tablet (10 mg total) by mouth in the morning.      BP is will controlled on the above meds, chronic, continue above medications as prescribed.   Orders: -     amLODIPine Besylate; Take 1 tablet (10 mg total) by mouth in the morning.  Dispense: 90 tablet; Refill: 1  Mixed hyperlipidemia     Return in about 6 months (around 09/03/2024) for annual physical exam.    Karie Georges, MD

## 2024-03-04 NOTE — Assessment & Plan Note (Signed)
 Current hypertension medications:       Sig   losartan-hydrochlorothiazide (HYZAAR) 100-25 MG tablet (Taking) TAKE 1 TABLET BY MOUTH DAILY   tadalafil (CIALIS) 5 MG tablet (Taking) Take 5 mg by mouth in the morning.   amLODipine (NORVASC) 10 MG tablet Take 1 tablet (10 mg total) by mouth in the morning.      BP is will controlled on the above meds, chronic, continue above medications as prescribed.

## 2024-03-24 ENCOUNTER — Encounter: Payer: Self-pay | Admitting: Family Medicine

## 2024-03-24 DIAGNOSIS — Z125 Encounter for screening for malignant neoplasm of prostate: Secondary | ICD-10-CM

## 2024-03-26 NOTE — Telephone Encounter (Signed)
 Order placed for patient-- please send ot someone at the front desk to schedule the lab appt.

## 2024-04-02 ENCOUNTER — Other Ambulatory Visit (INDEPENDENT_AMBULATORY_CARE_PROVIDER_SITE_OTHER)

## 2024-04-02 DIAGNOSIS — Z794 Long term (current) use of insulin: Secondary | ICD-10-CM

## 2024-04-02 DIAGNOSIS — E119 Type 2 diabetes mellitus without complications: Secondary | ICD-10-CM | POA: Diagnosis not present

## 2024-04-02 DIAGNOSIS — Z125 Encounter for screening for malignant neoplasm of prostate: Secondary | ICD-10-CM | POA: Diagnosis not present

## 2024-04-03 LAB — HEMOGLOBIN A1C: Hgb A1c MFr Bld: 7.6 % — ABNORMAL HIGH (ref 4.6–6.5)

## 2024-04-03 LAB — PSA: PSA: 0.49 ng/mL (ref 0.10–4.00)

## 2024-04-06 ENCOUNTER — Encounter: Payer: Self-pay | Admitting: Family Medicine

## 2024-04-28 DIAGNOSIS — N521 Erectile dysfunction due to diseases classified elsewhere: Secondary | ICD-10-CM | POA: Diagnosis not present

## 2024-04-28 DIAGNOSIS — N411 Chronic prostatitis: Secondary | ICD-10-CM | POA: Diagnosis not present

## 2024-04-28 DIAGNOSIS — N400 Enlarged prostate without lower urinary tract symptoms: Secondary | ICD-10-CM | POA: Diagnosis not present

## 2024-04-28 DIAGNOSIS — E1169 Type 2 diabetes mellitus with other specified complication: Secondary | ICD-10-CM | POA: Diagnosis not present

## 2024-05-23 ENCOUNTER — Other Ambulatory Visit: Payer: Self-pay | Admitting: Family Medicine

## 2024-05-23 DIAGNOSIS — I10 Essential (primary) hypertension: Secondary | ICD-10-CM

## 2024-05-27 ENCOUNTER — Other Ambulatory Visit: Payer: Self-pay | Admitting: Family Medicine

## 2024-05-27 DIAGNOSIS — I10 Essential (primary) hypertension: Secondary | ICD-10-CM

## 2024-06-03 ENCOUNTER — Other Ambulatory Visit (INDEPENDENT_AMBULATORY_CARE_PROVIDER_SITE_OTHER)

## 2024-06-03 ENCOUNTER — Other Ambulatory Visit: Payer: Self-pay | Admitting: Family Medicine

## 2024-06-03 DIAGNOSIS — E119 Type 2 diabetes mellitus without complications: Secondary | ICD-10-CM

## 2024-06-03 DIAGNOSIS — Z794 Long term (current) use of insulin: Secondary | ICD-10-CM

## 2024-06-03 LAB — HEMOGLOBIN A1C: Hgb A1c MFr Bld: 7.1 % — ABNORMAL HIGH (ref 4.6–6.5)

## 2024-06-09 ENCOUNTER — Ambulatory Visit: Payer: Self-pay | Admitting: Family Medicine

## 2024-08-05 ENCOUNTER — Other Ambulatory Visit: Payer: Self-pay | Admitting: Family Medicine

## 2024-08-05 DIAGNOSIS — I639 Cerebral infarction, unspecified: Secondary | ICD-10-CM

## 2024-08-19 ENCOUNTER — Other Ambulatory Visit: Payer: Self-pay | Admitting: Family Medicine

## 2024-08-19 DIAGNOSIS — I10 Essential (primary) hypertension: Secondary | ICD-10-CM

## 2024-08-26 ENCOUNTER — Other Ambulatory Visit: Payer: Self-pay | Admitting: Family Medicine

## 2024-08-26 DIAGNOSIS — E119 Type 2 diabetes mellitus without complications: Secondary | ICD-10-CM

## 2024-09-02 ENCOUNTER — Encounter: Payer: Self-pay | Admitting: Family Medicine

## 2024-09-02 ENCOUNTER — Ambulatory Visit: Admitting: Family Medicine

## 2024-09-02 VITALS — BP 120/80 | HR 66 | Temp 98.3°F | Ht 72.0 in | Wt 172.8 lb

## 2024-09-02 DIAGNOSIS — E119 Type 2 diabetes mellitus without complications: Secondary | ICD-10-CM | POA: Diagnosis not present

## 2024-09-02 DIAGNOSIS — Z794 Long term (current) use of insulin: Secondary | ICD-10-CM | POA: Diagnosis not present

## 2024-09-02 DIAGNOSIS — E782 Mixed hyperlipidemia: Secondary | ICD-10-CM | POA: Diagnosis not present

## 2024-09-02 DIAGNOSIS — Z23 Encounter for immunization: Secondary | ICD-10-CM

## 2024-09-02 DIAGNOSIS — I10 Essential (primary) hypertension: Secondary | ICD-10-CM

## 2024-09-02 DIAGNOSIS — R202 Paresthesia of skin: Secondary | ICD-10-CM

## 2024-09-02 LAB — LIPID PANEL
Cholesterol: 94 mg/dL (ref 0–200)
HDL: 38.7 mg/dL — ABNORMAL LOW (ref >39.00)
LDL Cholesterol: 45 mg/dL (ref 0–99)
NonHDL: 55.36
Total CHOL/HDL Ratio: 2
Triglycerides: 52 mg/dL (ref 0.0–149.0)
VLDL: 10.4 mg/dL (ref 0.0–40.0)

## 2024-09-02 LAB — COMPREHENSIVE METABOLIC PANEL WITH GFR
ALT: 15 U/L (ref 0–53)
AST: 15 U/L (ref 0–37)
Albumin: 4.4 g/dL (ref 3.5–5.2)
Alkaline Phosphatase: 55 U/L (ref 39–117)
BUN: 11 mg/dL (ref 6–23)
CO2: 28 meq/L (ref 19–32)
Calcium: 9.5 mg/dL (ref 8.4–10.5)
Chloride: 101 meq/L (ref 96–112)
Creatinine, Ser: 0.9 mg/dL (ref 0.40–1.50)
GFR: 91.05 mL/min (ref >60.00)
Glucose, Bld: 130 mg/dL — ABNORMAL HIGH (ref 70–99)
Potassium: 4.2 meq/L (ref 3.5–5.1)
Sodium: 136 meq/L (ref 135–145)
Total Bilirubin: 0.5 mg/dL (ref 0.2–1.2)
Total Protein: 6.9 g/dL (ref 6.0–8.3)

## 2024-09-02 LAB — VITAMIN B12: Vitamin B-12: 150 pg/mL — ABNORMAL LOW (ref 211–911)

## 2024-09-02 LAB — POCT GLYCOSYLATED HEMOGLOBIN (HGB A1C): Hemoglobin A1C: 6.8 % — AB (ref 4.0–5.6)

## 2024-09-02 NOTE — Patient Instructions (Signed)
 Fiber supplements: you need at least 30 grams of fiber.  Try to find foods that are dense in nutrients: chicken fish, eggs. High protein yogurts, active culture yogurts, cheese, nuts, berries and melons

## 2024-09-02 NOTE — Progress Notes (Signed)
 Established Patient Office Visit  Subjective   Patient ID: Aaron Martinez, male    DOB: 06-Oct-1961  Age: 63 y.o. MRN: [id number]  Chief Complaint  Patient presents with   Medical Management of Chronic Issues    HPI Discussed the use of AI scribe software for clinical note transcription with the patient, who gave verbal consent to proceed.  History of Present Illness   Aaron Martinez is a 63 year old male with type 2 diabetes who presents for a six-month follow-up visit.  Glycemic control and appetite changes - Type 2 diabetes mellitus with improved glycemic control; A1c decreased from 7.1 to 6.8 since April after increasing Ozempic  to 2.0 mg - Significant decrease in appetite since dose adjustment - Weight loss of 7 pounds over six months - Diet focused on fiber and protein-rich foods - Uses Metamucil as a fiber supplement  Gastrointestinal symptoms - Gastrointestinal discomfort, particularly with fried foods - Loose stools associated with consumption of fried foods - Bowel movements are regular unless consuming foods that upset his stomach - Typically has morning bowel movements after coffee  Peripheral neuropathy symptoms - Tingling sensations in the left foot - Symptoms improved with new, softer work shoes and inserts - Walks 9,000 to 12,000 steps daily - Symptoms less noticeable over the weekend  Tobacco use and smoking cessation efforts - Smokes 10 to 15 cigarettes per day - Previous use of nicotine  patches - Recent use of Zen pouches       Current Outpatient Medications  Medication Instructions   amLODipine  (NORVASC ) 10 mg, Oral, Every morning   atorvastatin  (LIPITOR) 10 mg, Oral, Every M-W-F   clopidogrel  (PLAVIX ) 75 mg, Oral, Daily   losartan -hydrochlorothiazide (HYZAAR) 100-25 MG tablet 1 tablet, Oral, Daily   metFORMIN  (GLUCOPHAGE ) 1000 MG tablet TAKE 1 TABLET BY MOUTH TWICE A DAY WITH A MEAL   nicotine  (NICOTINE  STEP 1) 21 mg, Transdermal,  Daily   nicotine  (NICOTINE  STEP 2) 14 mg, Transdermal, Daily   nicotine  (NICOTINE  STEP 3) 7 mg, Transdermal, Daily   Semaglutide  (2 MG/DOSE) 2 mg, Injection, Weekly   tadalafil (CIALIS) 5 mg, Every morning    Patient Active Problem List   Diagnosis Date Noted   CVA (cerebral vascular accident) (HCC) 10/28/2022   Hypokalemia 10/28/2022   HLD (hyperlipidemia) 10/28/2022   DMII (diabetes mellitus, type 2) (HCC) 10/28/2022   Tobacco dependence 04/22/2020   Benign essential HTN 01/02/2019     Review of Systems  All other systems reviewed and are negative.     Objective:     BP 120/80   Pulse 66   Temp 98.3 F (36.8 C) (Oral)   Ht 6' (1.829 m)   Wt 172 lb 12.8 oz (78.4 kg)   SpO2 96%   BMI 23.44 kg/m    Physical Exam Vitals reviewed.  Constitutional:      Appearance: Normal appearance. He is well-groomed and normal weight.  Cardiovascular:     Rate and Rhythm: Normal rate and regular rhythm.     Heart sounds: S1 normal and S2 normal. No murmur heard. Pulmonary:     Effort: Pulmonary effort is normal.     Breath sounds: Normal breath sounds and air entry. No rales.  Musculoskeletal:     Right lower leg: No edema.     Left lower leg: No edema.  Neurological:     General: No focal deficit present.     Mental Status: He is alert and oriented to person, place, and  time.     Gait: Gait is intact.  Psychiatric:        Mood and Affect: Mood and affect normal.      Results for orders placed or performed in visit on 09/02/24  POC HgB A1c  Result Value Ref Range   Hemoglobin A1C 6.8 (A) 4.0 - 5.6 %   HbA1c POC (<> result, manual entry)     HbA1c, POC (prediabetic range)     HbA1c, POC (controlled diabetic range)        The ASCVD Risk score (Arnett DK, et al., 2019) failed to calculate for the following reasons:   Risk score cannot be calculated because patient has a medical history suggesting prior/existing ASCVD    Assessment & Plan:  Type 2 diabetes  mellitus without complication, with long-term current use of insulin  (HCC) -     POCT glycosylated hemoglobin (Hb A1C) -     Collection capillary blood specimen -     Microalbumin / creatinine urine ratio; Future  Benign essential HTN -     Comprehensive metabolic panel with GFR; Future  Mixed hyperlipidemia -     Lipid panel; Future  Immunization due -     Flu vaccine trivalent PF, 6mos and older(Flulaval,Afluria,Fluarix,Fluzone)  Leg paresthesia -     Vitamin B12; Future   Assessment and Plan    Type 2 diabetes mellitus Type 2 diabetes mellitus with improved glycemic control. Hemoglobin A1c decreased from 7.1% to 6.8%. Tolerating Ozempic  2.0 mg well, with reduced appetite and minor weight loss. - Continue Ozempic  2.0 mg as prescribed - Advise dietary modifications focusing on high fiber and high protein intake - Recommend fiber supplements such as inulin, psyllium husk powder, or Metamucil to achieve at least 30 grams of fiber per day - Order annual blood work including cholesterol, liver, and kidney function tests  Left foot neuropathy New onset neuropathy symptoms in the left foot, with tingling extending to the side of the leg. Symptoms worsen at the end of the workday, possibly related to prolonged standing and footwear. Improved with new footwear. Differential diagnosis includes vitamin B12 deficiency. - Order vitamin B12 level to assess for deficiency - Advise continued use of supportive footwear with appropriate inserts  Nicotine  dependence Chronic nicotine  dependence with current smoking of 10-15 cigarettes per day. Previous nicotine  patch attempts unsuccessful. Zen pouches recently tried and tolerable. Discussed cardiovascular risks including arterial constriction. - Encourage use of Zen pouches as nicotine  replacement therapy, especially during work hours - Discuss potential benefits of nicotine  replacement therapy for lung health        Return in about 6 months  (around 03/03/2025) for DM.    Heron CHRISTELLA Sharper, MD

## 2024-09-03 LAB — MICROALBUMIN / CREATININE URINE RATIO
Creatinine,U: 82.9 mg/dL
Microalb Creat Ratio: 14.8 mg/g (ref 0.0–30.0)
Microalb, Ur: 1.2 mg/dL (ref 0.0–1.9)

## 2024-09-04 ENCOUNTER — Ambulatory Visit: Payer: Self-pay | Admitting: Family Medicine

## 2024-09-08 ENCOUNTER — Other Ambulatory Visit: Payer: Self-pay | Admitting: Family Medicine

## 2024-09-08 DIAGNOSIS — E119 Type 2 diabetes mellitus without complications: Secondary | ICD-10-CM

## 2024-10-19 DIAGNOSIS — L821 Other seborrheic keratosis: Secondary | ICD-10-CM | POA: Diagnosis not present

## 2024-10-19 DIAGNOSIS — L814 Other melanin hyperpigmentation: Secondary | ICD-10-CM | POA: Diagnosis not present

## 2024-10-19 DIAGNOSIS — D1801 Hemangioma of skin and subcutaneous tissue: Secondary | ICD-10-CM | POA: Diagnosis not present

## 2024-11-12 DIAGNOSIS — S2232XA Fracture of one rib, left side, initial encounter for closed fracture: Secondary | ICD-10-CM | POA: Diagnosis not present

## 2024-11-12 DIAGNOSIS — R9431 Abnormal electrocardiogram [ECG] [EKG]: Secondary | ICD-10-CM | POA: Diagnosis not present

## 2024-11-12 DIAGNOSIS — R0789 Other chest pain: Secondary | ICD-10-CM | POA: Diagnosis not present

## 2024-11-14 ENCOUNTER — Other Ambulatory Visit: Payer: Self-pay | Admitting: Family Medicine

## 2024-11-14 DIAGNOSIS — I10 Essential (primary) hypertension: Secondary | ICD-10-CM

## 2024-12-02 ENCOUNTER — Other Ambulatory Visit: Payer: Self-pay | Admitting: Family Medicine

## 2024-12-02 DIAGNOSIS — E782 Mixed hyperlipidemia: Secondary | ICD-10-CM

## 2025-03-03 ENCOUNTER — Ambulatory Visit: Admitting: Family Medicine
